# Patient Record
Sex: Female | Born: 1995 | Race: Black or African American | Hispanic: No | Marital: Single | Smoking: Former smoker
Health system: Southern US, Community
[De-identification: ages and names within clinical notes are randomized; demographics above are authoritative.]

## PROBLEM LIST (undated history)

## (undated) ENCOUNTER — Inpatient Hospital Stay (HOSPITAL_COMMUNITY): Payer: Medicaid Other | Admitting: Obstetrics and Gynecology

## (undated) ENCOUNTER — Inpatient Hospital Stay: Payer: Self-pay

## (undated) DIAGNOSIS — Z789 Other specified health status: Secondary | ICD-10-CM

## (undated) DIAGNOSIS — G43909 Migraine, unspecified, not intractable, without status migrainosus: Secondary | ICD-10-CM

## (undated) HISTORY — DX: Migraine, unspecified, not intractable, without status migrainosus: G43.909

## (undated) HISTORY — PX: NO PAST SURGERIES: SHX2092

---
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
## 2017-02-09 ENCOUNTER — Encounter: Payer: Self-pay | Admitting: Emergency Medicine

## 2017-02-09 ENCOUNTER — Emergency Department
Admission: EM | Admit: 2017-02-09 | Discharge: 2017-02-10 | Disposition: A | Discharge status: Home / Self Care | Payer: Medicaid Other | Attending: Student in an Organized Health Care Education/Training Program | Admitting: Student in an Organized Health Care Education/Training Program

## 2017-02-09 ENCOUNTER — Other Ambulatory Visit: Payer: Self-pay

## 2017-02-09 DIAGNOSIS — R509 Fever, unspecified: Secondary | ICD-10-CM | POA: Insufficient documentation

## 2017-02-09 DIAGNOSIS — M791 Myalgia, unspecified site: Secondary | ICD-10-CM | POA: Diagnosis not present

## 2017-02-09 DIAGNOSIS — R69 Illness, unspecified: Secondary | ICD-10-CM

## 2017-02-09 DIAGNOSIS — J029 Acute pharyngitis, unspecified: Secondary | ICD-10-CM | POA: Diagnosis not present

## 2017-02-09 DIAGNOSIS — Z79899 Other long term (current) drug therapy: Secondary | ICD-10-CM | POA: Diagnosis not present

## 2017-02-09 DIAGNOSIS — J111 Influenza due to unidentified influenza virus with other respiratory manifestations: Secondary | ICD-10-CM

## 2017-02-09 LAB — INFLUENZA PANEL BY PCR (TYPE A & B)
Influenza A By PCR: NEGATIVE
Influenza B By PCR: NEGATIVE

## 2017-02-09 LAB — GROUP A STREP BY PCR: Group A Strep by PCR: NOT DETECTED

## 2017-02-09 MED ORDER — ACETAMINOPHEN 325 MG PO TABS
650.0000 mg | ORAL_TABLET | Freq: Once | ORAL | Status: AC | PRN
  Administered 2017-02-09: 650 mg via ORAL
  Filled 2017-02-09: qty 2

## 2017-02-09 NOTE — ED Triage Notes (Signed)
Pt presents to ED with fever, body aches, sore throat. Onset today. Tearful in triage. No acute distress noted.

## 2017-02-09 NOTE — ED Triage Notes (Signed)
Patient to ER for c/o generalized body ache. States she is starting to have a headache now. Patient tearful at stat desk, but in no acute distress.

## 2017-02-10 NOTE — ED Provider Notes (Signed)
[facility] Emergency Department Provider Note  ____________________________________________  Time seen: Approximately 1:06 AM  I have reviewed the triage vital signs and the nursing notes.   HISTORY  Chief Complaint Generalized Body Aches; Fever; and Sore Throat    HPI Robin Roman is a 21 y.o. female presents to the emergency department with headache, pharyngitis, myalgias, rhinorrhea and congestion for 1 day.  Patient has been tolerating fluids and food by mouth but has had diminished appetite.  She denies chest pain, chest tightness, nausea, vomiting and abdominal pain.  Patient has been febrile.  No recent travel.  No alleviating measures of been attempted.  Past Medical History:  Diagnosis Date  . Medical history non-contributory     Patient Active Problem List   Diagnosis Date Noted  . Back pain affecting pregnancy in third trimester 02/11/2015  . Preterm labor 02/11/2015  . Decreased fetal movement 01/03/2015    Past Surgical History:  Procedure Laterality Date  . NO PAST SURGERIES      Prior to Admission medications   Medication Sig Start Date End Date Taking? Authorizing Provider  benzonatate (TESSALON PERLES) 100 MG capsule Take 1 capsule (100 mg total) by mouth 3 (three) times daily as needed for cough. 04/09/16 04/09/17  Enid DerryWagner, Ashley, PA-C  lidocaine (XYLOCAINE) 2 % solution Use as directed 5 mLs in the mouth or throat as needed for mouth pain. 04/09/16   Enid DerryWagner, Ashley, PA-C  naphazoline-pheniramine (NAPHCON-A) 0.025-0.3 % ophthalmic solution Place 1 drop into both eyes 4 (four) times daily as needed for irritation. 10/28/15   Sharman CheekStafford, Phillip, MD  ondansetron [facility](ZOFRAN) 4 MG tablet Take 1 tablet (4 mg total) by mouth daily as needed for nausea or vomiting. 04/09/16 04/09/17  Enid DerryWagner, Ashley, PA-C  tobramycin (TOBREX) 0.3 % ophthalmic solution Place 2 drops into both eyes every 4 (four) hours. While awake 10/25/15   Tommi RumpsSummers, Rhonda L, PA-C     Allergies Patient has no known allergies.  No family history on file.  Social History Social History   Tobacco Use  . Smoking status: Never Smoker  . Smokeless tobacco: Never Used  Substance Use Topics  . Alcohol use: No  . Drug use: No      Review of Systems  Constitutional: Patient has fever.  Eyes: No visual changes. No discharge ENT: Patient has congestion.  Cardiovascular: no chest pain. Respiratory: Patient has cough.  Gastrointestinal: No abdominal pain.  No nausea, no vomiting. Patient had diarrhea.  Genitourinary: Negative for dysuria. No hematuria Musculoskeletal: Patient has myalgias.  Skin: Negative for rash, abrasions, lacerations, ecchymosis. Neurological: Patient has headache, no focal weakness or numbness.      ____________________________________________   PHYSICAL EXAM:  VITAL SIGNS: ED Triage Vitals  Enc Vitals Group     BP 02/09/17 2147 133/77     Pulse Rate 02/09/17 2147 (!) 111     Resp 02/09/17 2147 20     Temp 02/09/17 2147 (!) 102.9 F (39.4 C)     Temp Source 02/09/17 2147 Oral     SpO2 02/09/17 2147 99 %     Weight 02/09/17 2147 200 lb (90.7 kg)     Height 02/09/17 2147 5\' 7"  (1.702 m)     Head Circumference --      Peak Flow --      Pain Score 02/09/17 2146 9     Pain Loc --      Pain Edu? --      Excl. in GC? --  Constitutional: Alert and oriented. Patient is lying supine. Eyes: Conjunctivae are normal. PERRL. EOMI. Head: Atraumatic. ENT:      Ears: Tympanic membranes are mildly injected with mild effusion bilaterally.       Nose: No congestion/rhinnorhea.      Mouth/Throat: Mucous membranes are moist. Posterior pharynx is mildly erythematous.  Hematological/Lymphatic/Immunilogical: No cervical lymphadenopathy.  Cardiovascular: Normal rate, regular rhythm. Normal S1 and S2.  Good peripheral circulation. Respiratory: Normal respiratory effort without tachypnea or retractions. Lungs CTAB. Good air entry to the  bases with no decreased or absent breath sounds. Gastrointestinal: Bowel sounds 4 quadrants. Soft and nontender to palpation. No guarding or rigidity. No palpable masses. No distention. No CVA tenderness. Musculoskeletal: Full range of motion to all extremities. No gross deformities appreciated. Neurologic:  Normal speech and language. No gross focal neurologic deficits are appreciated.  Skin:  Skin is warm, dry and intact. No rash noted. Psychiatric: Mood and affect are normal. Speech and behavior are normal. Patient exhibits appropriate insight and judgement.    ____________________________________________   LABS (all labs ordered are listed, but only abnormal results are displayed)  Labs Reviewed  GROUP A STREP BY PCR  INFLUENZA PANEL BY PCR (TYPE A & B)   ____________________________________________  EKG   ____________________________________________  RADIOLOGY   No results found.  ____________________________________________    PROCEDURES  Procedure(s) performed:    Procedures    Medications  acetaminophen (TYLENOL) tablet 650 mg (650 mg Oral Given 02/09/17 2153)     ____________________________________________   INITIAL IMPRESSION / ASSESSMENT AND PLAN / ED COURSE  Pertinent labs & imaging results that were available during my care of the patient were reviewed by me and considered in my medical decision making (see chart for details).  Review of the [place] CSRS was performed in accordance of the [facility] prior to dispensing any controlled drugs.     Assessment and plan Viral URI Patient presents to the emergency department with rhinorrhea, congestion, nonproductive cough and fever for 1 day.  History and physical exam findings are consistent with an influenza-like illness.  Patient was treated empirically with Tamiflu despite negative influenza testing by PCR.  Patient was advised to follow-up with primary care as needed.  All patient questions were  answered.     ____________________________________________  FINAL CLINICAL IMPRESSION(S) / ED DIAGNOSES  Final diagnoses:  Influenza-like illness      NEW MEDICATIONS STARTED DURING THIS VISIT:  ED Discharge Orders    None          This chart was dictated using voice recognition software/Dragon. Despite best efforts to proofread, errors can occur which can change the meaning. Any change was purely unintentional.    Orvil FeilWoods, Latamara Melder M, PA-C 02/10/17 [id number]    Willy Eddyobinson, Patrick, MD 02/10/17 [phone]

## 2017-06-09 ENCOUNTER — Emergency Department
Admission: EM | Admit: 2017-06-09 | Discharge: 2017-06-09 | Disposition: A | Discharge status: Home / Self Care | Payer: Medicaid Other | Attending: Emergency Medicine | Admitting: Emergency Medicine

## 2017-06-09 ENCOUNTER — Other Ambulatory Visit: Payer: Self-pay

## 2017-06-09 DIAGNOSIS — H109 Unspecified conjunctivitis: Secondary | ICD-10-CM

## 2017-06-09 DIAGNOSIS — Z79899 Other long term (current) drug therapy: Secondary | ICD-10-CM | POA: Insufficient documentation

## 2017-06-09 DIAGNOSIS — J069 Acute upper respiratory infection, unspecified: Secondary | ICD-10-CM | POA: Insufficient documentation

## 2017-06-09 MED ORDER — KETOROLAC TROMETHAMINE 0.4 % OP SOLN: 1.0000 [drp] | Freq: Four times a day (QID) | OPHTHALMIC | 0 refills | Status: DC

## 2017-06-09 MED ORDER — AMOXICILLIN-POT CLAVULANATE 875-125 MG PO TABS: 1.0000 | ORAL_TABLET | Freq: Two times a day (BID) | ORAL | 0 refills | Status: AC

## 2017-06-09 MED ORDER — ERYTHROMYCIN 5 MG/GM OP OINT: TOPICAL_OINTMENT | Freq: Three times a day (TID) | OPHTHALMIC | 0 refills | Status: AC

## 2017-06-09 MED ORDER — LORATADINE 10 MG PO TABS: 10.0000 mg | ORAL_TABLET | Freq: Every day | ORAL | 0 refills | Status: DC

## 2017-06-09 NOTE — ED Provider Notes (Signed)
[facility] Emergency Department Provider Note  ____________________________________________  Time seen: Approximately 9:36 AM  I have reviewed the triage vital signs and the nursing notes.   HISTORY  Chief Complaint Eye Drainage    HPI Robin Roman is a 22 y.o. female that presents to the emergency department for evaluation of right eye redness and swelling for one day and sore throat and ear pain since this morning. Eye has been watering and itching. No foreign body sensation. Eye has been watering but no crusty or yellow drainage. No trauma. No pain. She has tried clear eyes and compresses on the eye. No allergies. No contact use. No vision change, photophobia, ear pain, nausea, vomiting, diarrhea.    Past Medical History:  Diagnosis Date  . Medical history non-contributory     Patient Active Problem List   Diagnosis Date Noted  . Back pain affecting pregnancy in third trimester 02/11/2015  . Preterm labor 02/11/2015  . Decreased fetal movement 01/03/2015    Past Surgical History:  Procedure Laterality Date  . NO PAST SURGERIES      Prior to Admission medications   Medication Sig Start Date End Date Taking? Authorizing Provider  amoxicillin-clavulanate (AUGMENTIN) 875-125 MG tablet Take 1 tablet by mouth 2 (two) times daily for 10 days. 06/09/17 06/19/17  Enid Derry, PA-C  erythromycin [facility]) ophthalmic ointment Place into the right eye 3 (three) times daily for 10 days. Place a 1/2 inch ribbon of ointment into the lower eyelid. 06/09/17 06/19/17  Enid Derry, PA-C  ketorolac (ACULAR) 0.4 % SOLN Apply 1 drop to eye 4 (four) times daily. 06/09/17   Enid Derry, PA-C  lidocaine (XYLOCAINE) 2 % solution Use as directed 5 mLs in the mouth or throat as needed for mouth pain. 04/09/16   Enid Derry, PA-C  loratadine (CLARITIN) 10 MG tablet Take 1 tablet (10 mg total) by mouth daily. 06/09/17 06/09/18  Enid Derry, PA-C   naphazoline-pheniramine (NAPHCON-A) 0.025-0.3 % ophthalmic solution Place 1 drop into both eyes 4 (four) times daily as needed for irritation. 10/28/15   Sharman Cheek, MD  tobramycin (TOBREX) 0.3 % ophthalmic solution Place 2 drops into both eyes every 4 (four) hours. While awake 10/25/15   Tommi Rumps, PA-C    Allergies Patient has no known allergies.  No family history on file.  Social History Social History   Tobacco Use  . Smoking status: Never Smoker  . Smokeless tobacco: Never Used  Substance Use Topics  . Alcohol use: No  . Drug use: No     Review of Systems  Constitutional: No fever/chills Respiratory: No cough. No SOB. Gastrointestinal: No abdominal pain.  No nausea, no vomiting.  Musculoskeletal: Negative for musculoskeletal pain. Skin: Negative for rash, abrasions, lacerations, ecchymosis. Neurological: Negative for headaches   ____________________________________________   PHYSICAL EXAM:  VITAL SIGNS: ED Triage Vitals  Enc Vitals Group     BP 06/09/17 0739 (!) 142/77     Pulse Rate 06/09/17 0739 (!) 108     Resp 06/09/17 0739 17     Temp 06/09/17 0739 98.9 F (37.2 C)     Temp Source 06/09/17 0739 Oral     SpO2 06/09/17 0739 99 %     Weight 06/09/17 0740 210 lb (95.3 kg)     Height 06/09/17 0740 5\' 7"  (1.702 m)     Head Circumference --      Peak Flow --      Pain Score 06/09/17 0739 0  Pain Loc --      Pain Edu? --      Excl. in GC? --      Constitutional: Alert and oriented. Well appearing and in no acute distress. Eyes: Left conjunctivae is normal. Right conjunctivae is injected. PERRL. EOMI. Vision 20/20 bilaterally. No purulent discharge. Mild swelling to right eye with no overlying erythema. Head: Atraumatic. ENT:      Ears: Tympanic membranes erythematous.      Nose: Mild congestion/rhinnorhea.      Mouth/Throat: Mucous membranes are moist. Oropharynx erythematous. Tonsils not enlarged. Uvula midline. Neck: No stridor.    Cardiovascular: Normal rate, regular rhythm.  Good peripheral circulation. Respiratory: Normal respiratory effort without tachypnea or retractions. Lungs CTAB. Good air entry to the bases with no decreased or absent breath sounds. Gastrointestinal: Bowel sounds 4 quadrants. Soft and nontender to palpation. No guarding or rigidity. No palpable masses. No distention.  Musculoskeletal: Full range of motion to all extremities. No gross deformities appreciated. Neurologic:  Normal speech and language. No gross focal neurologic deficits are appreciated.  Skin:  Skin is warm, dry and intact. No rash noted. Psychiatric: Mood and affect are normal. Speech and behavior are normal. Patient exhibits appropriate insight and judgement.   ____________________________________________   LABS (all labs ordered are listed, but only abnormal results are displayed)  Labs Reviewed - No data to display ____________________________________________  EKG   ____________________________________________  RADIOLOGY   No results found.  ____________________________________________    PROCEDURES  Procedure(s) performed:    Procedures    Medications - No data to display   ____________________________________________   INITIAL IMPRESSION / ASSESSMENT AND PLAN / ED COURSE  Pertinent labs & imaging results that were available during my care of the patient were reviewed by me and considered in my medical decision making (see chart for details).  Review of the [place] CSRS was performed in accordance of the [facility] prior to dispensing any controlled drugs.   Patient's diagnosis is consistent with URI and conjunctivitis. Vital signs and exam are reassuring. Conjunctivitis is likely viral or allergic. Patient will be discharged home with prescriptions for claritin, acular, augmentin, erythromycin ointment.  She will be given a prescription for antibiotics to take if symptoms worsen or do not improve with  claritin and acular. Patient is to follow up with PCP as directed. Patient is given ED precautions to return to the ED for any worsening or new symptoms. Mother was notified.     ____________________________________________  FINAL CLINICAL IMPRESSION(S) / ED DIAGNOSES  Final diagnoses:  Conjunctivitis of right eye, unspecified conjunctivitis type  Upper respiratory tract infection, unspecified type      NEW MEDICATIONS STARTED DURING THIS VISIT:  ED Discharge Orders        Ordered    loratadine (CLARITIN) 10 MG tablet  Daily     06/09/17 1023    ketorolac (ACULAR) 0.4 % SOLN  4 times daily     06/09/17 1023    amoxicillin-clavulanate (AUGMENTIN) 875-125 MG tablet  2 times daily     06/09/17 1023    erythromycin [facility](ROMYCIN) ophthalmic ointment  3 times daily     06/09/17 1023          This chart was dictated using voice recognition software/Dragon. Despite best efforts to proofread, errors can occur which can change the meaning. Any change was purely unintentional.    Enid DerryWagner, Matison Nuccio, PA-C 06/09/17 1434    Phineas SemenGoodman, Graydon, MD 06/09/17 [phone]

## 2017-06-09 NOTE — ED Triage Notes (Signed)
Pt c/o right eye redess and irritation for the past 2 days, denies injury

## 2017-06-09 NOTE — Discharge Instructions (Addendum)
Begin Acular and Claritin today. Begin erythromycin and Augmentin tomorrow if symptoms worsen or are not improving.

## 2017-06-09 NOTE — ED Notes (Addendum)
See triage note  Presents with drainage and irritation to right eye  Sx;'s started 2 days ago. also this am woke up with slight sore throat

## 2017-06-12 ENCOUNTER — Emergency Department
Admission: EM | Admit: 2017-06-12 | Discharge: 2017-06-12 | Disposition: A | Discharge status: Home / Self Care | Payer: Medicaid Other | Attending: Emergency Medicine | Admitting: Emergency Medicine

## 2017-06-12 ENCOUNTER — Other Ambulatory Visit: Payer: Self-pay

## 2017-06-12 ENCOUNTER — Encounter: Payer: Self-pay | Admitting: Emergency Medicine

## 2017-06-12 DIAGNOSIS — J029 Acute pharyngitis, unspecified: Secondary | ICD-10-CM | POA: Insufficient documentation

## 2017-06-12 DIAGNOSIS — Z79899 Other long term (current) drug therapy: Secondary | ICD-10-CM | POA: Insufficient documentation

## 2017-06-12 DIAGNOSIS — M791 Myalgia, unspecified site: Secondary | ICD-10-CM | POA: Insufficient documentation

## 2017-06-12 DIAGNOSIS — H9203 Otalgia, bilateral: Secondary | ICD-10-CM | POA: Insufficient documentation

## 2017-06-12 MED ORDER — PSEUDOEPH-BROMPHEN-DM 30-2-10 MG/5ML PO SYRP: 5.0000 mL | ORAL_SOLUTION | Freq: Four times a day (QID) | ORAL | 0 refills | Status: DC | PRN

## 2017-06-12 MED ORDER — DIPHENHYDRAMINE HCL 12.5 MG/5ML PO ELIX
12.5000 mg | ORAL_SOLUTION | Freq: Once | ORAL | Status: AC
  Administered 2017-06-12: 12.5 mg via ORAL
  Filled 2017-06-12: qty 5

## 2017-06-12 MED ORDER — LIDOCAINE VISCOUS 2 % MT SOLN
5.0000 mL | Freq: Once | OROMUCOSAL | Status: AC
  Administered 2017-06-12: 5 mL via OROMUCOSAL
  Filled 2017-06-12: qty 15

## 2017-06-12 MED ORDER — LIDOCAINE VISCOUS 2 % MT SOLN: 5.0000 mL | Freq: Four times a day (QID) | OROMUCOSAL | 0 refills | Status: DC | PRN

## 2017-06-12 NOTE — ED Triage Notes (Signed)
Sore throat x 2 days. R eye irritation x 3 days.

## 2017-06-12 NOTE — Discharge Instructions (Signed)
Advised to fill prescription for antibiotics from previous visit.

## 2017-06-12 NOTE — ED Provider Notes (Signed)
[facility] Emergency Department Provider Note   ____________________________________________   First MD Initiated Contact with Patient 06/12/17 0745     (approximate)  I have reviewed the triage vital signs and the nursing notes.   HISTORY  Chief Complaint Sore Throat    HPI Robin Roman is a 22 y.o. female patient was seen this facility 2 days ago sore throat and irritation.  Patient was diagnosed with pharyngitis conjunctivitis.  Patient given a prescription for eyedrops, antihistamine, and Augmentin Augmentin.  Patient state she did not fill the antibiotic because she was confused on the discharge instructions.  Patient state complaint improving with the eyedrops and antihistamine but sore throat has worsened.  Patient rates the pain as 8/10.  Patient describes her pain as "sore".  Patient cannot tolerate food and fluids with discomfort.  Past Medical History:  Diagnosis Date  . Medical history non-contributory     Patient Active Problem List   Diagnosis Date Noted  . Back pain affecting pregnancy in third trimester 02/11/2015  . Preterm labor 02/11/2015  . Decreased fetal movement 01/03/2015    Past Surgical History:  Procedure Laterality Date  . NO PAST SURGERIES      Prior to Admission medications   Medication Sig Start Date End Date Taking? Authorizing Provider  amoxicillin-clavulanate (AUGMENTIN) 875-125 MG tablet Take 1 tablet by mouth 2 (two) times daily for 10 days. 06/09/17 06/19/17  Enid Derry, PA-C  brompheniramine-pseudoephedrine-DM 30-2-10 MG/5ML syrup Take 5 mLs by mouth 4 (four) times daily as needed. Mix with 5 mL of viscous lidocaine for swish and swallow. 06/12/17   Joni Reining, PA-C  erythromycin [facility]) ophthalmic ointment Place into the right eye 3 (three) times daily for 10 days. Place a 1/2 inch ribbon of ointment into the lower eyelid. 06/09/17 06/19/17  Enid Derry, PA-C  ketorolac (ACULAR) 0.4 % SOLN Apply 1  drop to eye 4 (four) times daily. 06/09/17   Enid Derry, PA-C  lidocaine (XYLOCAINE) 2 % solution Use as directed 5 mLs in the mouth or throat as needed for mouth pain. 04/09/16   Enid Derry, PA-C  lidocaine (XYLOCAINE) 2 % solution Use as directed 5 mLs in the mouth or throat every 6 (six) hours as needed for mouth pain. Mix with Bromfed-DM for swish and swallow. 06/12/17   Joni Reining, PA-C  loratadine (CLARITIN) 10 MG tablet Take 1 tablet (10 mg total) by mouth daily. 06/09/17 06/09/18  Enid Derry, PA-C  naphazoline-pheniramine (NAPHCON-A) 0.025-0.3 % ophthalmic solution Place 1 drop into both eyes 4 (four) times daily as needed for irritation. 10/28/15   Sharman Cheek, MD  tobramycin (TOBREX) 0.3 % ophthalmic solution Place 2 drops into both eyes every 4 (four) hours. While awake 10/25/15   Tommi Rumps, PA-C    Allergies Patient has no known allergies.  No family history on file.  Social History Social History   Tobacco Use  . Smoking status: Never Smoker  . Smokeless tobacco: Never Used  Substance Use Topics  . Alcohol use: No  . Drug use: No    Review of Systems Constitutional: No fever/chills Eyes: No visual changes. ENT: Sore throat.   Cardiovascular: Denies chest pain. Respiratory: Denies shortness of breath. Gastrointestinal: No abdominal pain.  No nausea, no vomiting.  No diarrhea.  No constipation. Genitourinary: Negative for dysuria. Musculoskeletal: Negative for back pain. Skin: Negative for rash. Neurological: Negative for headaches, focal weakness or numbness.   ____________________________________________   PHYSICAL EXAM:  VITAL SIGNS: ED Triage Vitals  Enc Vitals Group     BP 06/12/17 0741 135/77     Pulse Rate 06/12/17 0741 (!) 101     Resp 06/12/17 0741 20     Temp 06/12/17 0741 100.3 F (37.9 C)     Temp Source 06/12/17 0741 Oral     SpO2 06/12/17 0741 100 %     Weight 06/12/17 0742 210 lb (95.3 kg)     Height 06/12/17 0742   (1.727 m)     Head Circumference --      Peak Flow --      Pain Score 06/12/17 0742 8     Pain Loc --      Pain Edu? --      Excl. in GC? --    Constitutional: Alert and oriented. Well appearing and in no acute distress. Eyes: Conjunctivae are erythematous l. PERRL. EOMI. Head: Atraumatic. Nose: Edematous nasal turbinates clear rhinorrhea Mouth/Throat: Mucous membranes are moist.  Oropharynx erythematous. Neck: No stridor.  Hematological/Lymphatic/Immunilogical: No cervical lymphadenopathy. Cardiovascular: Normal rate, regular rhythm. Grossly normal heart sounds.  Good peripheral circulation. Respiratory: Normal respiratory effort.  No retractions. Lungs CTAB. Skin:  Skin is warm, dry and intact. No rash noted. Psychiatric: Mood and affect are normal. Speech and behavior are normal.  ____________________________________________   LABS (all labs ordered are listed, but only abnormal results are displayed)  Labs Reviewed - No data to display ____________________________________________  EKG   ____________________________________________  RADIOLOGY  ED MD interpretation:    Official radiology report(s): No results found.  ____________________________________________   PROCEDURES  Procedure(s) performed: None  Procedures  Critical Care performed: No  ____________________________________________   INITIAL IMPRESSION / ASSESSMENT AND PLAN / ED COURSE  As part of my medical decision making, I reviewed the following data within the electronic MEDICAL RECORD NUMBER    Patient presents with sore throat for 2 days.  Patient was confusional discharge care instructions and did not fill antibiotics as directed.  Patient given a mix of viscous lidocaine and Benadryl with relief of sore throat.  Patient advised to fill prescription.  Patient also given a prescription for Benadryl elixir and viscous lidocaine to mix and take as directed.  Patient advised to follow-up with  PCP.      ____________________________________________   FINAL CLINICAL IMPRESSION(S) / ED DIAGNOSES  Final diagnoses:  Pharyngitis, unspecified etiology     ED Discharge Orders        Ordered    lidocaine (XYLOCAINE) 2 % solution  Every 6 hours PRN     06/12/17 0755    brompheniramine-pseudoephedrine-DM 30-2-10 MG/5ML syrup  4 times daily PRN     06/12/17 0755       Note:  This document was prepared using Dragon voice recognition software and may include unintentional dictation errors.    Joni Reining, PA-C 06/12/17 0802    Schaevitz, Myra Rude, MD 06/12/17 [phone]

## 2017-06-12 NOTE — ED Notes (Signed)
See triage note  Presents with sore throat and body aches  Was seen couple of days ago for pink eye and sore throat  States she was given rx's for same. But did not get the Augmentin filled  States she was told to get eye meds and claritin first   Low grade fever on arrival  States her sore throat is getting worse with body aches and bilateral ear pain

## 2017-06-24 ENCOUNTER — Encounter: Payer: Self-pay | Admitting: Emergency Medicine

## 2017-06-24 ENCOUNTER — Other Ambulatory Visit: Payer: Self-pay

## 2017-06-24 ENCOUNTER — Emergency Department
Admission: EM | Admit: 2017-06-24 | Discharge: 2017-06-24 | Disposition: A | Discharge status: Home / Self Care | Payer: Medicaid Other | Attending: Emergency Medicine | Admitting: Emergency Medicine

## 2017-06-24 DIAGNOSIS — B373 Candidiasis of vulva and vagina: Secondary | ICD-10-CM

## 2017-06-24 DIAGNOSIS — B3731 Acute candidiasis of vulva and vagina: Secondary | ICD-10-CM

## 2017-06-24 DIAGNOSIS — L290 Pruritus ani: Secondary | ICD-10-CM

## 2017-06-24 LAB — WET PREP, GENITAL
CLUE CELLS WET PREP: NONE SEEN
Sperm: NONE SEEN
Trich, Wet Prep: NONE SEEN

## 2017-06-24 MED ORDER — FLUCONAZOLE 150 MG PO TABS: 150.0000 mg | ORAL_TABLET | Freq: Every day | ORAL | 0 refills | Status: DC

## 2017-06-24 NOTE — Discharge Instructions (Addendum)
Follow-up with your primary care doctor at [facility] or follow-up with [facility] acute care.  Begin doing sitz bath twice a day.  Do not apply any medication to the rectal area.  Take Diflucan which should take care of your yeast infection.  You may also obtain Monistat over-the-counter as needed for exterior vaginal itching.

## 2017-06-24 NOTE — ED Notes (Signed)
When this nurse went to discharge pt she wants provider to look at her "private area"  Complaining of itching no discahrge

## 2017-06-24 NOTE — ED Notes (Signed)
See triage note. States she was seen and treated for pink eye and URI recently  States her stools have been a different color  Now over the past 2-3 days she has had some rectal itching

## 2017-06-24 NOTE — ED Triage Notes (Signed)
Here for itching to rectum X 2 days. VSS. No fever.  Pt wants someone to look and see if she has a hemorrhoid that is making her itch.

## 2017-06-24 NOTE — ED Provider Notes (Signed)
[facility] Emergency Department Provider Note  ____________________________________________   First MD Initiated Contact with Patient 06/24/17 0740     (approximate)  I have reviewed the triage vital signs and the nursing notes.   HISTORY  Chief Complaint itches at rectum  HPI Robin Roman is a 22 y.o. female is here with complaint of anal itching for 2 days.  Patient states that she is worried that she has a hemorrhoid.  She also complains of some vaginal itching but denies any dysuria or frequency.  Patient states she has been drinking cranberry juice without any relief.   Past Medical History:  Diagnosis Date  . Medical history non-contributory     Patient Active Problem List   Diagnosis Date Noted  . Back pain affecting pregnancy in third trimester 02/11/2015  . Preterm labor 02/11/2015  . Decreased fetal movement 01/03/2015    Past Surgical History:  Procedure Laterality Date  . NO PAST SURGERIES      Prior to Admission medications   Medication Sig Start Date End Date Taking? Authorizing Provider  fluconazole (DIFLUCAN) 150 MG tablet Take 1 tablet (150 mg total) by mouth daily. 06/24/17   Tommi Rumps, PA-C    Allergies Patient has no known allergies.  History reviewed. No pertinent family history.  Social History Social History   Tobacco Use  . Smoking status: Never Smoker  . Smokeless tobacco: Never Used  Substance Use Topics  . Alcohol use: No  . Drug use: No    Review of Systems Constitutional: No fever/chills Cardiovascular: Denies chest pain. Respiratory: Denies shortness of breath. Gastrointestinal:   No nausea, no vomiting.  Genitourinary: Negative for dysuria.  Positive for vaginal itching.  Positive for anal itching. Musculoskeletal: Negative for back pain. Skin: Negative for rash. Neurological: Negative for headaches, focal weakness or numbness. ___________________________________________   PHYSICAL  EXAM:  VITAL SIGNS: ED Triage Vitals  Enc Vitals Group     BP 06/24/17 0737 139/82     Pulse Rate 06/24/17 0736 74     Resp 06/24/17 0736 16     Temp 06/24/17 0736 98.7 F (37.1 C)     Temp Source 06/24/17 0736 Oral     SpO2 06/24/17 0736 99 %     Weight 06/24/17 0736 210 lb (95.3 kg)     Height 06/24/17 0736  (1.727 m)     Head Circumference --      Peak Flow --      Pain Score 06/24/17 0736 0     Pain Loc --      Pain Edu? --      Excl. in GC? --    Constitutional: Alert and oriented. Well appearing and in no acute distress. Eyes: Conjunctivae are normal.  Head: Atraumatic. Neck: No stridor.   Cardiovascular: Normal rate, regular rhythm. Grossly normal heart sounds.  Good peripheral circulation. Respiratory: Normal respiratory effort.  No retractions. Lungs CTAB. Gastrointestinal: Soft and nontender.  Rectal exam is negative with no fissures or hemorrhoids noted. Genitourinary: There is white vaginal secretions noted.  No external genitalia abnormalities noted. Musculoskeletal: Moves upper and lower extremities any difficulty.  Normal gait was noted. Neurologic:  Normal speech and language. No gross focal neurologic deficits are appreciated.  Skin:  Skin is warm, dry and intact. No rash noted. Psychiatric: Mood and affect are normal. Speech and behavior are normal.  ____________________________________________   LABS (all labs ordered are listed, but only abnormal results are displayed)  Labs Reviewed  WET PREP, GENITAL - Abnormal; Notable for the following components:      Result Value   Yeast Wet Prep HPF POC PRESENT (*)    WBC, Wet Prep HPF POC RARE (*)    All other components within normal limits   ________________________________________   PROCEDURES  Procedure(s) performed: None  Procedures  Critical Care performed: No  ____________________________________________   INITIAL IMPRESSION / ASSESSMENT AND PLAN / ED COURSE Patient was made aware  that her wet prep did show evidence of yeast.  This most likely is because of her anal itching as well.  Patient was given a prescription for Diflucan 150 mg 1 tablet.  She was also made aware that she could buy over-the-counter Monistat cream to apply to the area as needed for itching control.  She is to follow-up with her PCP at [facility] if any continued problems.  ____________________________________________   FINAL CLINICAL IMPRESSION(S) / ED DIAGNOSES  Final diagnoses:  Pruritus ani  Yeast vaginitis     ED Discharge Orders        Ordered    fluconazole (DIFLUCAN) 150 MG tablet  Daily     06/24/17 0859       Note:  This document was prepared using Dragon voice recognition software and may include unintentional dictation errors.    Tommi Rumps, PA-C 06/24/17 0912    Nita Sickle, MD 06/24/17 1057

## 2017-08-21 ENCOUNTER — Emergency Department
Admission: EM | Admit: 2017-08-21 | Discharge: 2017-08-21 | Disposition: A | Discharge status: Home / Self Care | Payer: Medicaid Other | Attending: Emergency Medicine | Admitting: Emergency Medicine

## 2017-08-21 ENCOUNTER — Other Ambulatory Visit: Payer: Self-pay

## 2017-08-21 ENCOUNTER — Encounter: Payer: Self-pay | Admitting: Emergency Medicine

## 2017-08-21 DIAGNOSIS — N6011 Diffuse cystic mastopathy of right breast: Secondary | ICD-10-CM | POA: Insufficient documentation

## 2017-08-21 DIAGNOSIS — N644 Mastodynia: Secondary | ICD-10-CM

## 2017-08-21 DIAGNOSIS — F17290 Nicotine dependence, other tobacco product, uncomplicated: Secondary | ICD-10-CM | POA: Insufficient documentation

## 2017-08-21 DIAGNOSIS — Z79899 Other long term (current) drug therapy: Secondary | ICD-10-CM | POA: Insufficient documentation

## 2017-08-21 DIAGNOSIS — N6012 Diffuse cystic mastopathy of left breast: Secondary | ICD-10-CM | POA: Insufficient documentation

## 2017-08-21 MED ORDER — IBUPROFEN 800 MG PO TABS: 800.0000 mg | ORAL_TABLET | Freq: Three times a day (TID) | ORAL | 0 refills | Status: DC | PRN

## 2017-08-21 NOTE — ED Notes (Signed)
Pt c/o right breast lump with pain. Does not have obgyn. [facility] No redness or heat.

## 2017-08-21 NOTE — ED Notes (Signed)
This RN went in to discharge pt; pt was not in room. No discharge instructions given by this RN. Janise, PA-C did give pt instructions at bedside prior to patient walking out, per provider.

## 2017-08-21 NOTE — ED Notes (Signed)
Pt reports right breast tenderness, particularly on top x 1 week. No injury. States it is a dull, constant, tender pain.

## 2017-08-21 NOTE — ED Notes (Signed)
ED Provider at bedside. 

## 2017-08-21 NOTE — Discharge Instructions (Addendum)
You have a common breast condition called fibrocystic breast syndrome. This may cause intermittent breast pain and tenderness. Take the ibuprofen as needed. You may also apply warm compresses to reduce pain. Follow-up with Dr. Tracey HarriesPringle for a primary provider referral [facility] for further evaulation.

## 2017-08-21 NOTE — ED Triage Notes (Addendum)
R breast pain x 1 week. Denies fall or injury. States worse with palpation.

## 2017-08-22 NOTE — ED Provider Notes (Signed)
[facility] Emergency Department Provider Note ____________________________________________  Time seen: 1120  I have reviewed the triage vital signs and the nursing notes.  HISTORY  Chief Complaint  Breast Pain  HPI Robin Roman is a 22 y.o. female presents herself to the ED for evaluation of right breast pain.  Injury, accident, or trauma.  She also denies any swelling, redness, warmth, induration, or nipple discharge. She describes intermittent sharp pain to the upper lobes of the right breast. She notes intermittent symptoms over the last week. She denies any typical premenstrual symptoms.   Past Medical History:  Diagnosis Date  . Medical history non-contributory     Patient Active Problem List   Diagnosis Date Noted  . Back pain affecting pregnancy in third trimester 02/11/2015  . Preterm labor 02/11/2015  . Decreased fetal movement 01/03/2015    Past Surgical History:  Procedure Laterality Date  . NO PAST SURGERIES      Prior to Admission medications   Medication Sig Start Date End Date Taking? Authorizing Provider  fluconazole (DIFLUCAN) 150 MG tablet Take 1 tablet (150 mg total) by mouth daily. 06/24/17   Tommi RumpsSummers, Rhonda L, PA-C  ibuprofen (ADVIL,MOTRIN) 800 MG tablet Take 1 tablet (800 mg total) by mouth every 8 (eight) hours as needed. 08/21/17   Medha Pippen, Charlesetta IvoryJenise V Bacon, PA-C    Allergies Patient has no known allergies.  No family history on file.  Social History Social History   Tobacco Use  . Smoking status: Current Every Day Smoker    Packs/day: 1.00    Types: Cigars  . Smokeless tobacco: Never Used  Substance Use Topics  . Alcohol use: No  . Drug use: No    Review of Systems  Constitutional: Negative for fever. Eyes: Negative for visual changes. ENT: Negative for sore throat. Cardiovascular: Negative for chest pain. Respiratory: Negative for shortness of breath. Gastrointestinal: Negative for abdominal pain, vomiting  and diarrhea. Genitourinary: Negative for dysuria. Musculoskeletal: Negative for back pain. Skin: Negative for rash. Right breast tenderness as above Neurological: Negative for headaches, focal weakness or numbness. ____________________________________________  PHYSICAL EXAM:  VITAL SIGNS: ED Triage Vitals  Enc Vitals Group     BP 08/21/17 1020 (!) 150/73     Pulse Rate 08/21/17 1020 91     Resp 08/21/17 1020 18     Temp 08/21/17 1020 98.6 F (37 C)     Temp Source 08/21/17 1020 Oral     SpO2 08/21/17 1020 99 %     Weight 08/21/17 1021 200 lb (90.7 kg)     Height 08/21/17 1021 5\' 8"  (1.727 m)     Head Circumference --      Peak Flow --      Pain Score 08/21/17 1032 2     Pain Loc --      Pain Edu? --      Excl. in GC? --     Constitutional: Alert and oriented. Well appearing and in no distress. Head: Normocephalic and atraumatic. Eyes: Conjunctivae are normal. Normal extraocular movements Neck: Supple. No thyromegaly. Hematological/Lymphatic/Immunological: No cervical supra-/infraclavicular, or axillary lymphadenopathy. Breast: normal gross appearance of the breast. No retractions, dimpling, or peau 'd orange skin appearance. Palpable cystic variations noted to the breast tissue, most prominent between the two upper lobes. No nipple retraction or discharge noted.  Cardiovascular: Normal rate, regular rhythm. Normal distal pulses. Respiratory: Normal respiratory effort. No wheezes/rales/rhonchi. Musculoskeletal: Nontender with normal range of motion in all extremities.  Neurologic:  Normal gait without ataxia. Normal speech and language. No gross focal neurologic deficits are appreciated. Skin:  Skin is warm, dry and intact. No rash noted. ____________________________________________  INITIAL IMPRESSION / ASSESSMENT AND PLAN / ED COURSE  Patient with ED evaluation of intermittent, fleeting right breast tenderness. The patient exam does not represent any infectious etiology.  He symptoms likely represent a intermittent mastodynia and underlying fibrocystic breast changes. She is reassured by the exam and diagnosis. She will follow-up with her provider for ongoing symptoms. She may take OTC ibuprofen as needed. She is referred to [facility] for further eval as needed.  ____________________________________________  FINAL CLINICAL IMPRESSION(S) / ED DIAGNOSES  Final diagnoses:  Fibrocystic breast changes of both breasts  Mastodynia of right breast      Karmen Stabs, Charlesetta Ivory, PA-C 08/22/17 1611    Jene Every, MD 08/23/17 [phone]

## 2017-10-16 ENCOUNTER — Emergency Department
Admission: EM | Admit: 2017-10-16 | Discharge: 2017-10-16 | Disposition: A | Discharge status: Home / Self Care | Payer: Medicaid Other | Attending: Emergency Medicine | Admitting: Emergency Medicine

## 2017-10-16 ENCOUNTER — Other Ambulatory Visit: Payer: Self-pay

## 2017-10-16 ENCOUNTER — Encounter: Payer: Self-pay | Admitting: Intensive Care

## 2017-10-16 DIAGNOSIS — J0100 Acute maxillary sinusitis, unspecified: Secondary | ICD-10-CM

## 2017-10-16 DIAGNOSIS — H02845 Edema of left lower eyelid: Secondary | ICD-10-CM | POA: Insufficient documentation

## 2017-10-16 DIAGNOSIS — Z79899 Other long term (current) drug therapy: Secondary | ICD-10-CM | POA: Insufficient documentation

## 2017-10-16 DIAGNOSIS — F17200 Nicotine dependence, unspecified, uncomplicated: Secondary | ICD-10-CM | POA: Insufficient documentation

## 2017-10-16 DIAGNOSIS — H02846 Edema of left eye, unspecified eyelid: Secondary | ICD-10-CM

## 2017-10-16 MED ORDER — SULFAMETHOXAZOLE-TRIMETHOPRIM 800-160 MG PO TABS: 1.0000 | ORAL_TABLET | Freq: Two times a day (BID) | ORAL | 0 refills | Status: AC

## 2017-10-16 MED ORDER — AMOXICILLIN-POT CLAVULANATE 875-125 MG PO TABS: 1.0000 | ORAL_TABLET | Freq: Two times a day (BID) | ORAL | 0 refills | Status: AC

## 2017-10-16 NOTE — ED Provider Notes (Signed)
[facility] Emergency Department Provider Note  ____________________________________________  Time seen: Approximately 9:09 AM  I have reviewed the triage vital signs and the nursing notes.   HISTORY  Chief Complaint Conjunctivitis (left)    HPI Robin Roman is a 22 y.o. female that presents emergency department for evaluation of mild left lower eyelid swelling for 1 day.  Patient states that she wakes up in the morning and has some crusting to the inside of her eye.  Her daughter recently had pinkeye.  Patient has been sick with nasal congestion for the last 2 weeks.  She does not have a red eye, eye pain, and is not having any trouble seeing.  No fever, chills, headache, cough, shortness of breath.  Past Medical History:  Diagnosis Date  . Medical history non-contributory     Patient Active Problem List   Diagnosis Date Noted  . Back pain affecting pregnancy in third trimester 02/11/2015  . Preterm labor 02/11/2015  . Decreased fetal movement 01/03/2015    Past Surgical History:  Procedure Laterality Date  . NO PAST SURGERIES      Prior to Admission medications   Medication Sig Start Date End Date Taking? Authorizing Provider  amoxicillin-clavulanate (AUGMENTIN) 875-125 MG tablet Take 1 tablet by mouth 2 (two) times daily for 10 days. 10/16/17 10/26/17  Enid Derry, PA-C  fluconazole (DIFLUCAN) 150 MG tablet Take 1 tablet (150 mg total) by mouth daily. 06/24/17   Tommi Rumps, PA-C  ibuprofen (ADVIL,MOTRIN) 800 MG tablet Take 1 tablet (800 mg total) by mouth every 8 (eight) hours as needed. 08/21/17   Menshew, Charlesetta Ivory, PA-C  sulfamethoxazole-trimethoprim (BACTRIM DS,SEPTRA DS) 800-160 MG tablet Take 1 tablet by mouth 2 (two) times daily for 7 days. 10/16/17 10/23/17  Enid Derry, PA-C    Allergies Patient has no known allergies.  History reviewed. No pertinent family history.  Social History Social History   Tobacco Use  .  Smoking status: Current Some Day Smoker    Packs/day: 1.00    Types: Cigars  . Smokeless tobacco: Never Used  Substance Use Topics  . Alcohol use: No  . Drug use: No     Review of Systems  Constitutional: No fever/chills ENT: Positive for nasal congestion. Cardiovascular: No chest pain. Respiratory: No cough. No SOB. Gastrointestinal: No nausea, no vomiting.  Musculoskeletal: Negative for musculoskeletal pain. Skin: Negative for abrasions, lacerations, ecchymosis. Neurological: Negative for headaches   ____________________________________________   PHYSICAL EXAM:  VITAL SIGNS: ED Triage Vitals  Enc Vitals Group     BP 10/16/17 0854 127/69     Pulse Rate 10/16/17 0854 74     Resp 10/16/17 0854 14     Temp 10/16/17 0854 98.9 F (37.2 C)     Temp Source 10/16/17 0854 Oral     SpO2 10/16/17 0854 100 %     Weight 10/16/17 0855 200 lb (90.7 kg)     Height 10/16/17 0855 5\' 7"  (1.702 m)     Head Circumference --      Peak Flow --      Pain Score 10/16/17 0855 0     Pain Loc --      Pain Edu? --      Excl. in GC? --      Constitutional: Alert and oriented. Well appearing and in no acute distress. Eyes: Conjunctivae are normal. PERRL. EOMI. Mild swelling and erythema to left lower eyelid. Head: Atraumatic. ENT:      Ears:  Nose: Mild congestion/rhinnorhea.      Mouth/Throat: Mucous membranes are moist.  Neck: No stridor.   Cardiovascular: Normal rate, regular rhythm.  Good peripheral circulation. Respiratory: Normal respiratory effort without tachypnea or retractions. Lungs CTAB. Good air entry to the bases with no decreased or absent breath sounds. Musculoskeletal: Full range of motion to all extremities. No gross deformities appreciated. Neurologic:  Normal speech and language. No gross focal neurologic deficits are appreciated.  Skin:  Skin is warm, dry and intact. No rash noted. Psychiatric: Mood and affect are normal. Speech and behavior are normal. Patient  exhibits appropriate insight and judgement.   ____________________________________________   LABS (all labs ordered are listed, but only abnormal results are displayed)  Labs Reviewed - No data to display ____________________________________________  EKG   ____________________________________________  RADIOLOGY   No results found.  ____________________________________________    PROCEDURES  Procedure(s) performed:    Procedures    Medications - No data to display   ____________________________________________   INITIAL IMPRESSION / ASSESSMENT AND PLAN / ED COURSE  Pertinent labs & imaging results that were available during my care of the patient were reviewed by me and considered in my medical decision making (see chart for details).  Review of the [place] CSRS was performed in accordance of the [facility] prior to dispensing any controlled drugs.     Patient's diagnosis is consistent with sinusitis.  Vital signs and exam are reassuring.  Patient is starting to have some mild swelling to lower left eyelid.  Patient will be given Bactrim and Augmentin to cover for preseptal cellulitis.  Patient will be discharged home with prescriptions for Bactrim and Augmentin. Patient is to follow up with primary care as directed. Patient is given ED precautions to return to the ED for any worsening or new symptoms.     ____________________________________________  FINAL CLINICAL IMPRESSION(S) / ED DIAGNOSES  Final diagnoses:  Acute maxillary sinusitis, recurrence not specified  Swelling of left eyelid      NEW MEDICATIONS STARTED DURING THIS VISIT:  ED Discharge Orders         Ordered    amoxicillin-clavulanate (AUGMENTIN) 875-125 MG tablet  2 times daily     10/16/17 0941    sulfamethoxazole-trimethoprim (BACTRIM DS,SEPTRA DS) 800-160 MG tablet  2 times daily     10/16/17 0941              This chart was dictated using voice recognition software/Dragon.  Despite best efforts to proofread, errors can occur which can change the meaning. Any change was purely unintentional.    Enid DerryWagner, Ziaire Hagos, PA-C 10/16/17 1504    Arnaldo NatalMalinda, Paul F, MD 10/16/17 [phone]

## 2017-10-16 NOTE — ED Triage Notes (Signed)
Patient is here for possible pink eye (left). Reports some drainage and puffiness around eye. Reports her daughter just got over pink eye

## 2018-12-21 ENCOUNTER — Other Ambulatory Visit: Payer: Self-pay

## 2018-12-21 DIAGNOSIS — Z20822 Contact with and (suspected) exposure to covid-19: Secondary | ICD-10-CM

## 2018-12-22 LAB — NOVEL CORONAVIRUS, NAA: SARS-CoV-2, NAA: NOT DETECTED

## 2018-12-23 ENCOUNTER — Telehealth: Payer: Self-pay | Admitting: General Practice

## 2018-12-23 NOTE — Telephone Encounter (Signed)
Negative COVID results given. Patient results "NOT Detected." Caller expressed understanding. ° °

## 2019-03-13 ENCOUNTER — Other Ambulatory Visit: Payer: Self-pay

## 2019-03-13 ENCOUNTER — Encounter: Payer: Self-pay | Admitting: Emergency Medicine

## 2019-03-13 ENCOUNTER — Emergency Department
Admission: EM | Admit: 2019-03-13 | Discharge: 2019-03-13 | Disposition: A | Discharge status: Home / Self Care | Payer: Self-pay

## 2019-03-13 NOTE — ED Notes (Signed)
Pt called x 1 from lobby.  Notified by security that pt was waiting in her car.  Security to front door to attempt to see, pt, could not visualize her in parking lot.  This RN attempted to call pt from number in chart, number was her mother, who states she will attempt to reach pt.  Will attempt to call her from lobby again.

## 2019-03-13 NOTE — ED Notes (Signed)
Pt again called in lobby with no answer.  Pt LWOT at this time.

## 2019-03-13 NOTE — ED Triage Notes (Signed)
Patient presents to the ED with abdominal pain and vomiting.  Patient is in no obvious distress at this time.  Patient drinking large brown colored drink while checking in at triage.

## 2019-03-13 NOTE — ED Notes (Signed)
Called in the waiting room with no answers.

## 2019-03-13 NOTE — ED Notes (Signed)
Pt called from lobby with no answer.  Stepped to front door, no one waiting outside, could not see pt in parking lot.  Will call once more.

## 2019-03-17 ENCOUNTER — Emergency Department
Admission: EM | Admit: 2019-03-17 | Discharge: 2019-03-17 | Disposition: A | Discharge status: Home / Self Care | Payer: Self-pay | Attending: Emergency Medicine | Admitting: Emergency Medicine

## 2019-03-17 ENCOUNTER — Other Ambulatory Visit: Payer: Self-pay

## 2019-03-17 ENCOUNTER — Encounter: Payer: Self-pay | Admitting: Emergency Medicine

## 2019-03-17 ENCOUNTER — Emergency Department: Payer: Self-pay

## 2019-03-17 DIAGNOSIS — O26891 Other specified pregnancy related conditions, first trimester: Secondary | ICD-10-CM | POA: Insufficient documentation

## 2019-03-17 DIAGNOSIS — R112 Nausea with vomiting, unspecified: Secondary | ICD-10-CM | POA: Insufficient documentation

## 2019-03-17 DIAGNOSIS — F17290 Nicotine dependence, other tobacco product, uncomplicated: Secondary | ICD-10-CM | POA: Insufficient documentation

## 2019-03-17 DIAGNOSIS — O219 Vomiting of pregnancy, unspecified: Secondary | ICD-10-CM

## 2019-03-17 DIAGNOSIS — R1032 Left lower quadrant pain: Secondary | ICD-10-CM | POA: Insufficient documentation

## 2019-03-17 DIAGNOSIS — R1110 Vomiting, unspecified: Secondary | ICD-10-CM

## 2019-03-17 DIAGNOSIS — Z3A01 Less than 8 weeks gestation of pregnancy: Secondary | ICD-10-CM | POA: Insufficient documentation

## 2019-03-17 LAB — URINALYSIS, COMPLETE (UACMP) WITH MICROSCOPIC
Bilirubin Urine: NEGATIVE
Glucose, UA: NEGATIVE mg/dL
Hgb urine dipstick: NEGATIVE
Ketones, ur: NEGATIVE mg/dL
Leukocytes,Ua: NEGATIVE
Nitrite: NEGATIVE
Protein, ur: NEGATIVE mg/dL
Specific Gravity, Urine: 1.021 (ref 1.005–1.030)
pH: 7 (ref 5.0–8.0)

## 2019-03-17 LAB — CBC WITH DIFFERENTIAL/PLATELET
Abs Immature Granulocytes: 0.03 10*3/uL (ref 0.00–0.07)
Basophils Absolute: 0.1 10*3/uL (ref 0.0–0.1)
Basophils Relative: 1 %
Eosinophils Absolute: 0.1 10*3/uL (ref 0.0–0.5)
Eosinophils Relative: 1 %
HCT: 43.6 % (ref 36.0–46.0)
Hemoglobin: 14.8 g/dL (ref 12.0–15.0)
Immature Granulocytes: 0 %
Lymphocytes Relative: 19 %
Lymphs Abs: 1.9 10*3/uL (ref 0.7–4.0)
MCH: 29.8 pg (ref 26.0–34.0)
MCHC: 33.9 g/dL (ref 30.0–36.0)
MCV: 87.7 fL (ref 80.0–100.0)
Monocytes Absolute: 0.8 10*3/uL (ref 0.1–1.0)
Monocytes Relative: 8 %
Neutro Abs: 7.1 10*3/uL (ref 1.7–7.7)
Neutrophils Relative %: 71 %
Platelets: 278 10*3/uL (ref 150–400)
RBC: 4.97 MIL/uL (ref 3.87–5.11)
RDW: 13 % (ref 11.5–15.5)
WBC: 9.9 10*3/uL (ref 4.0–10.5)
nRBC: 0 % (ref 0.0–0.2)

## 2019-03-17 LAB — COMPREHENSIVE METABOLIC PANEL
ALT: 21 U/L (ref 0–44)
AST: 17 U/L (ref 15–41)
Albumin: 4.5 g/dL (ref 3.5–5.0)
Alkaline Phosphatase: 49 U/L (ref 38–126)
Anion gap: 8 (ref 5–15)
BUN: 5 mg/dL — ABNORMAL LOW (ref 6–20)
CO2: 24 mmol/L (ref 22–32)
Calcium: 9.3 mg/dL (ref 8.9–10.3)
Chloride: 104 mmol/L (ref 98–111)
Creatinine, Ser: 0.6 mg/dL (ref 0.44–1.00)
GFR calc Af Amer: >60 mL/min (ref >60)
GFR calc non Af Amer: >60 mL/min (ref >60)
Glucose, Bld: 98 mg/dL (ref 70–99)
Potassium: 3.5 mmol/L (ref 3.5–5.1)
Sodium: 136 mmol/L (ref 135–145)
Total Bilirubin: 0.8 mg/dL (ref 0.3–1.2)
Total Protein: 7.6 g/dL (ref 6.5–8.1)

## 2019-03-17 LAB — WET PREP, GENITAL
Clue Cells Wet Prep HPF POC: NONE SEEN
Sperm: NONE SEEN
Trich, Wet Prep: NONE SEEN
Yeast Wet Prep HPF POC: NONE SEEN

## 2019-03-17 LAB — POCT PREGNANCY, URINE: Preg Test, Ur: POSITIVE — AB

## 2019-03-17 LAB — HCG, QUANTITATIVE, PREGNANCY: hCG, Beta Chain, Quant, S: 50334 m[IU]/mL — ABNORMAL HIGH (ref <5)

## 2019-03-17 LAB — LIPASE, BLOOD: Lipase: 23 U/L (ref 11–51)

## 2019-03-17 MED ORDER — LACTATED RINGERS IV BOLUS
1000.0000 mL | Freq: Once | INTRAVENOUS | Status: AC
  Administered 2019-03-17: 09:00:00 1000 mL via INTRAVENOUS

## 2019-03-17 MED ORDER — ONDANSETRON 4 MG PO TBDP: 4.0000 mg | ORAL_TABLET | Freq: Three times a day (TID) | ORAL | 0 refills | Status: DC | PRN

## 2019-03-17 MED ORDER — ONDANSETRON HCL 4 MG/2ML IJ SOLN
4.0000 mg | Freq: Once | INTRAMUSCULAR | Status: AC
  Administered 2019-03-17: 09:00:00 4 mg via INTRAVENOUS
  Filled 2019-03-17: qty 2

## 2019-03-17 NOTE — ED Triage Notes (Signed)
C/O LLQ abdominal pain and vomiting.  Patient had similar symptoms one week ago but symptoms worsened last night.

## 2019-03-17 NOTE — ED Provider Notes (Signed)
[facility] Emergency Department Provider Note   ____________________________________________   First MD Initiated Contact with Patient 03/17/19 0818     (approximate)  I have reviewed the triage vital signs and the nursing notes.   HISTORY  Chief Complaint Abdominal Pain    HPI Robin Roman is a 24 y.o. female with no significant past medical history who presents to the ED complaining of abdominal pain and vomiting. Patient reports approximately 1 week of intermittent left lower quadrant pain as well as frequent episodes of vomiting. She states that whenever she goes to eat something, she would develop pain as well as vomiting about 20 min afterwards. She will vomit anywhere from 3-5 times per day and has had a couple episodes of diarrhea. She has had occasional cramping pain in her left lower quadrant that seem to be worse last night, although pain is currently minimal. She has not had any fevers, chills, cough, chest pain, or shortness of breath. She denies any dysuria, hematuria, vaginal bleeding, or vaginal discharge. Her LMP was Dec 4 and she does admit it is possible she could be pregnant.        Past Medical History:  Diagnosis Date  . Medical history non-contributory     Patient Active Problem List   Diagnosis Date Noted  . Back pain affecting pregnancy in third trimester 02/11/2015  . Preterm labor 02/11/2015  . Decreased fetal movement 01/03/2015    Past Surgical History:  Procedure Laterality Date  . NO PAST SURGERIES      Prior to Admission medications   Medication Sig Start Date End Date Taking? Authorizing Provider  fluconazole (DIFLUCAN) 150 MG tablet Take 1 tablet (150 mg total) by mouth daily. 06/24/17   Tommi Rumps, PA-C  ibuprofen (ADVIL,MOTRIN) 800 MG tablet Take 1 tablet (800 mg total) by mouth every 8 (eight) hours as needed. 08/21/17   Menshew, Charlesetta Ivory, PA-C  ondansetron (ZOFRAN ODT) 4 MG disintegrating tablet  Take 1 tablet (4 mg total) by mouth every 8 (eight) hours as needed for nausea or vomiting. 03/17/19   Chesley Noon, MD    Allergies Patient has no known allergies.  No family history on file.  Social History Social History   Tobacco Use  . Smoking status: Current Some Day Smoker    Packs/day: 1.00    Types: Cigars  . Smokeless tobacco: Never Used  Substance Use Topics  . Alcohol use: No  . Drug use: No    Review of Systems  Constitutional: No fever/chills Eyes: No visual changes. ENT: No sore throat. Cardiovascular: Denies chest pain. Respiratory: Denies shortness of breath. Gastrointestinal: Positive for abdominal pain. Positive for nausea, vomiting, and diarrhea.  No constipation. Genitourinary: Negative for dysuria. Musculoskeletal: Negative for back pain. Skin: Negative for rash. Neurological: Negative for headaches, focal weakness or numbness.  ____________________________________________   PHYSICAL EXAM:  VITAL SIGNS: ED Triage Vitals  Enc Vitals Group     BP 03/17/19 0816 (!) 142/91     Pulse Rate 03/17/19 0816 (!) 55     Resp 03/17/19 0816 18     Temp 03/17/19 0816 98.4 F (36.9 C)     Temp Source 03/17/19 0816 Oral     SpO2 03/17/19 0816 100 %     Weight 03/17/19 0813 199 lb 15.3 oz (90.7 kg)     Height 03/17/19 0813 5\' 7"  (1.702 m)     Head Circumference --      Peak Flow --  Pain Score 03/17/19 0812 8     Pain Loc --      Pain Edu? --      Excl. in GC? --     Constitutional: Alert and oriented. Eyes: Conjunctivae are normal. Head: Atraumatic. Nose: No congestion/rhinnorhea. Mouth/Throat: Mucous membranes are moist. Neck: Normal ROM Cardiovascular: Normal rate, regular rhythm. Grossly normal heart sounds. Respiratory: Normal respiratory effort.  No retractions. Lungs CTAB. Gastrointestinal: Soft and nontender. No distention. Genitourinary: Cervical os closed with no bleeding noted.  No cervical motion or next tenderness.  Musculoskeletal: No lower extremity tenderness nor edema. Neurologic:  Normal speech and language. No gross focal neurologic deficits are appreciated. Skin:  Skin is warm, dry and intact. No rash noted. Psychiatric: Mood and affect are normal. Speech and behavior are normal.  ____________________________________________   LABS (all labs ordered are listed, but only abnormal results are displayed)  Labs Reviewed  WET PREP, GENITAL - Abnormal; Notable for the following components:      Result Value   WBC, Wet Prep HPF POC MANY (*)    All other components within normal limits  URINALYSIS, COMPLETE (UACMP) WITH MICROSCOPIC - Abnormal; Notable for the following components:   Color, Urine YELLOW (*)    APPearance CLEAR (*)    Bacteria, UA RARE (*)    All other components within normal limits  COMPREHENSIVE METABOLIC PANEL - Abnormal; Notable for the following components:   BUN 5 (*)    All other components within normal limits  HCG, QUANTITATIVE, PREGNANCY - Abnormal; Notable for the following components:   hCG, Beta Chain, Quant, S 50,334 (*)    All other components within normal limits  POCT PREGNANCY, URINE - Abnormal; Notable for the following components:   Preg Test, Ur POSITIVE (*)    All other components within normal limits  CBC WITH DIFFERENTIAL/PLATELET  LIPASE, BLOOD  POC URINE PREG, ED     PROCEDURES  Procedure(s) performed (including Critical Care):  Procedures   ____________________________________________   INITIAL IMPRESSION / ASSESSMENT AND PLAN / ED COURSE       24 year old female presents to the ED with 1 week of intermittent vomiting, diarrhea, and crampy left lower quadrant abdominal pain. Her LMP was Dec 4 and we will check pregnancy testing. Abdominal exam is benign with no focal tenderness at this time, will screen labs including LFTs and lipase but I would be suspicious for a gastroenteritis. No right lower quadrant tenderness to suggest  appendicitis and ovarian torsion seems less likely. Plan to perform pelvic exam to assess for cervicitis, PID, or other concerning pathology.  Pelvic exam unremarkable.  Pregnancy testing is positive and ultrasound shows intrauterine pregnancy at approximately 7 weeks.  No bleeding to necessitate RhoGam.  Nausea and pain are well controlled at this time, will prescribe Zofran for use as needed and counseled patient to use Tylenol.  Patient provided with referral to establish care with OB/GYN, counseled to return to the ED for new or worsening symptoms.  Patient agrees with plan.      ____________________________________________   FINAL CLINICAL IMPRESSION(S) / ED DIAGNOSES  Final diagnoses:  Abdominal pain during pregnancy in first trimester  Nausea and vomiting in pregnancy     ED Discharge Orders         Ordered    ondansetron (ZOFRAN ODT) 4 MG disintegrating tablet  Every 8 hours PRN     03/17/19 1051           Note:  This document was  prepared using Systems analyst and may include unintentional dictation errors.   Blake Divine, MD 03/17/19 [phone]

## 2019-04-20 DIAGNOSIS — O0992 Supervision of high risk pregnancy, unspecified, second trimester: Secondary | ICD-10-CM

## 2019-04-20 HISTORY — DX: Supervision of high risk pregnancy, unspecified, second trimester: O09.92

## 2019-06-02 ENCOUNTER — Emergency Department
Admission: EM | Admit: 2019-06-02 | Discharge: 2019-06-03 | Disposition: A | Discharge status: Home / Self Care | Payer: Medicaid Other | Attending: Emergency Medicine | Admitting: Emergency Medicine

## 2019-06-02 ENCOUNTER — Other Ambulatory Visit: Payer: Self-pay

## 2019-06-02 DIAGNOSIS — Z5321 Procedure and treatment not carried out due to patient leaving prior to being seen by health care provider: Secondary | ICD-10-CM | POA: Diagnosis not present

## 2019-06-02 DIAGNOSIS — O26899 Other specified pregnancy related conditions, unspecified trimester: Secondary | ICD-10-CM

## 2019-06-02 DIAGNOSIS — O26892 Other specified pregnancy related conditions, second trimester: Secondary | ICD-10-CM | POA: Diagnosis present

## 2019-06-02 DIAGNOSIS — R109 Unspecified abdominal pain: Secondary | ICD-10-CM

## 2019-06-02 DIAGNOSIS — Z3A19 19 weeks gestation of pregnancy: Secondary | ICD-10-CM | POA: Diagnosis not present

## 2019-06-02 NOTE — ED Triage Notes (Addendum)
Pt to the er for abd pain that started at 0600 that is constant and lower abd upper abd and back. Pt denies vaginal bleeding. Pt is currently 19 weeks and 2 days. Pt last pregnancy was premature at 28 weeks.

## 2019-06-03 ENCOUNTER — Emergency Department: Payer: Medicaid Other

## 2019-06-03 LAB — URINALYSIS, COMPLETE (UACMP) WITH MICROSCOPIC
Bilirubin Urine: NEGATIVE
Glucose, UA: NEGATIVE mg/dL
Hgb urine dipstick: NEGATIVE
Ketones, ur: NEGATIVE mg/dL
Nitrite: NEGATIVE
Protein, ur: 30 mg/dL — AB
Specific Gravity, Urine: 1.029 (ref 1.005–1.030)
pH: 5 (ref 5.0–8.0)

## 2019-06-03 LAB — COMPREHENSIVE METABOLIC PANEL
ALT: 17 U/L (ref 0–44)
AST: 14 U/L — ABNORMAL LOW (ref 15–41)
Albumin: 3.7 g/dL (ref 3.5–5.0)
Alkaline Phosphatase: 43 U/L (ref 38–126)
Anion gap: 8 (ref 5–15)
BUN: 5 mg/dL — ABNORMAL LOW (ref 6–20)
CO2: 22 mmol/L (ref 22–32)
Calcium: 9.1 mg/dL (ref 8.9–10.3)
Chloride: 106 mmol/L (ref 98–111)
Creatinine, Ser: 0.43 mg/dL — ABNORMAL LOW (ref 0.44–1.00)
GFR calc Af Amer: >60 mL/min (ref >60)
GFR calc non Af Amer: >60 mL/min (ref >60)
Glucose, Bld: 84 mg/dL (ref 70–99)
Potassium: 3.6 mmol/L (ref 3.5–5.1)
Sodium: 136 mmol/L (ref 135–145)
Total Bilirubin: 0.5 mg/dL (ref 0.3–1.2)
Total Protein: 6.8 g/dL (ref 6.5–8.1)

## 2019-06-03 LAB — CBC
HCT: 36.2 % (ref 36.0–46.0)
Hemoglobin: 12.6 g/dL (ref 12.0–15.0)
MCH: 30.6 pg (ref 26.0–34.0)
MCHC: 34.8 g/dL (ref 30.0–36.0)
MCV: 87.9 fL (ref 80.0–100.0)
Platelets: 259 10*3/uL (ref 150–400)
RBC: 4.12 MIL/uL (ref 3.87–5.11)
RDW: 12.5 % (ref 11.5–15.5)
WBC: 11 10*3/uL — ABNORMAL HIGH (ref 4.0–10.5)
nRBC: 0 % (ref 0.0–0.2)

## 2019-06-03 LAB — URIC ACID: Uric Acid, Serum: 3.9 mg/dL (ref 2.5–7.1)

## 2019-06-03 LAB — HCG, QUANTITATIVE, PREGNANCY: hCG, Beta Chain, Quant, S: 17659 m[IU]/mL — ABNORMAL HIGH (ref <5)

## 2019-06-03 LAB — LIPASE, BLOOD: Lipase: 25 U/L (ref 11–51)

## 2019-06-03 NOTE — ED Notes (Signed)
Pt called L&D to get her lab results. She was told to ask me., I told pt I cant interpret results and give. Pt states she aint waiting.

## 2019-09-11 ENCOUNTER — Observation Stay
Admission: EM | Admit: 2019-09-11 | Discharge: 2019-09-11 | Disposition: A | Discharge status: Home / Self Care | Payer: Medicaid Other | Attending: Obstetrics and Gynecology | Admitting: Obstetrics and Gynecology

## 2019-09-11 ENCOUNTER — Other Ambulatory Visit: Payer: Self-pay

## 2019-09-11 DIAGNOSIS — O26893 Other specified pregnancy related conditions, third trimester: Principal | ICD-10-CM | POA: Insufficient documentation

## 2019-09-11 DIAGNOSIS — O163 Unspecified maternal hypertension, third trimester: Secondary | ICD-10-CM

## 2019-09-11 DIAGNOSIS — O1203 Gestational edema, third trimester: Secondary | ICD-10-CM | POA: Insufficient documentation

## 2019-09-11 DIAGNOSIS — R030 Elevated blood-pressure reading, without diagnosis of hypertension: Secondary | ICD-10-CM | POA: Insufficient documentation

## 2019-09-11 DIAGNOSIS — R519 Headache, unspecified: Secondary | ICD-10-CM | POA: Diagnosis not present

## 2019-09-11 DIAGNOSIS — Z3A33 33 weeks gestation of pregnancy: Secondary | ICD-10-CM | POA: Insufficient documentation

## 2019-09-11 DIAGNOSIS — O26849 Uterine size-date discrepancy, unspecified trimester: Secondary | ICD-10-CM

## 2019-09-11 HISTORY — DX: Uterine size-date discrepancy, unspecified trimester: O26.849

## 2019-09-11 HISTORY — DX: Unspecified maternal hypertension, third trimester: O16.3

## 2019-09-11 LAB — URINALYSIS, COMPLETE (UACMP) WITH MICROSCOPIC
Bilirubin Urine: NEGATIVE
Glucose, UA: NEGATIVE mg/dL
Hgb urine dipstick: NEGATIVE
Ketones, ur: NEGATIVE mg/dL
Leukocytes,Ua: NEGATIVE
Nitrite: NEGATIVE
Protein, ur: NEGATIVE mg/dL
Specific Gravity, Urine: 1.025 (ref 1.005–1.030)
pH: 5 (ref 5.0–8.0)

## 2019-09-11 LAB — COMPREHENSIVE METABOLIC PANEL
ALT: 13 U/L (ref 0–44)
AST: 16 U/L (ref 15–41)
Albumin: 3.2 g/dL — ABNORMAL LOW (ref 3.5–5.0)
Alkaline Phosphatase: 86 U/L (ref 38–126)
Anion gap: 8 (ref 5–15)
BUN: 6 mg/dL (ref 6–20)
CO2: 24 mmol/L (ref 22–32)
Calcium: 9.1 mg/dL (ref 8.9–10.3)
Chloride: 106 mmol/L (ref 98–111)
Creatinine, Ser: 0.61 mg/dL (ref 0.44–1.00)
GFR calc Af Amer: >60 mL/min (ref >60)
GFR calc non Af Amer: >60 mL/min (ref >60)
Glucose, Bld: 103 mg/dL — ABNORMAL HIGH (ref 70–99)
Potassium: 3.2 mmol/L — ABNORMAL LOW (ref 3.5–5.1)
Sodium: 138 mmol/L (ref 135–145)
Total Bilirubin: 0.7 mg/dL (ref 0.3–1.2)
Total Protein: 6.5 g/dL (ref 6.5–8.1)

## 2019-09-11 LAB — CBC
HCT: 31.8 % — ABNORMAL LOW (ref 36.0–46.0)
Hemoglobin: 11 g/dL — ABNORMAL LOW (ref 12.0–15.0)
MCH: 30.6 pg (ref 26.0–34.0)
MCHC: 34.6 g/dL (ref 30.0–36.0)
MCV: 88.6 fL (ref 80.0–100.0)
Platelets: 235 10*3/uL (ref 150–400)
RBC: 3.59 MIL/uL — ABNORMAL LOW (ref 3.87–5.11)
RDW: 13 % (ref 11.5–15.5)
WBC: 11.2 10*3/uL — ABNORMAL HIGH (ref 4.0–10.5)
nRBC: 0 % (ref 0.0–0.2)

## 2019-09-11 LAB — PROTEIN / CREATININE RATIO, URINE
Creatinine, Urine: 212 mg/dL
Protein Creatinine Ratio: 0.08 mg/mg{Cre} (ref 0.00–0.15)
Total Protein, Urine: 16 mg/dL

## 2019-09-11 MED ORDER — CALCIUM CARBONATE ANTACID 500 MG PO CHEW
2.0000 | CHEWABLE_TABLET | ORAL | Status: DC | PRN
  Administered 2019-09-11: 400 mg via ORAL
  Filled 2019-09-11: qty 2

## 2019-09-11 MED ORDER — ACETAMINOPHEN 325 MG PO TABS
650.0000 mg | ORAL_TABLET | ORAL | Status: DC | PRN
  Administered 2019-09-11: 650 mg via ORAL
  Filled 2019-09-11: qty 2

## 2019-09-11 NOTE — Discharge Summary (Signed)
TRIAGE VISIT with NST   Robin Roman is a 24 y.o. G2P0101. She is at [redacted]w[redacted]d gestation.  Indication: Elevated Blood Pressure at home, headache  S: Resting comfortably. no CTX, no VB. Active fetal movement. Denies  RUQ pain, visual changes SOB. +new onset ankle edema.  O:  BP 124/74   Pulse 85   Temp 98.4 F (36.9 C) (Oral)   Resp 16   Ht 5\' 7"  (1.702 m)   Wt (!) 108.4 kg   LMP 01/20/2019   BMI 37.43 kg/m  Results for orders placed or performed during the hospital encounter of 09/11/19 (from the past 48 hour(s))  Protein / creatinine ratio, urine   Collection Time: 09/11/19  6:46 PM  Result Value Ref Range   Creatinine, Urine 212 mg/dL   Total Protein, Urine 16 mg/dL   Protein Creatinine Ratio 0.08 0.00 - 0.15 mg/mg[Cre]  Urinalysis, Complete w Microscopic   Collection Time: 09/11/19  6:46 PM  Result Value Ref Range   Color, Urine YELLOW (A) YELLOW   APPearance HAZY (A) CLEAR   Specific Gravity, Urine 1.025 1.005 - 1.030   pH 5.0 5.0 - 8.0   Glucose, UA NEGATIVE NEGATIVE mg/dL   Hgb urine dipstick NEGATIVE NEGATIVE   Bilirubin Urine NEGATIVE NEGATIVE   Ketones, ur NEGATIVE NEGATIVE mg/dL   Protein, ur NEGATIVE NEGATIVE mg/dL   Nitrite NEGATIVE NEGATIVE   Leukocytes,Ua NEGATIVE NEGATIVE   RBC / HPF 6-10 0 - 5 RBC/hpf   WBC, UA 0-5 0 - 5 WBC/hpf   Bacteria, UA RARE (A) NONE SEEN   Squamous Epithelial / LPF 0-5 0 - 5   Mucus PRESENT    Ca Oxalate Crys, UA PRESENT   CBC   Collection Time: 09/11/19  6:53 PM  Result Value Ref Range   WBC 11.2 (H) 4.0 - 10.5 K/uL   RBC 3.59 (L) 3.87 - 5.11 MIL/uL   Hemoglobin 11.0 (L) 12.0 - 15.0 g/dL   HCT 09/13/19 (L) 36 - 46 %   MCV 88.6 80.0 - 100.0 fL   MCH 30.6 26.0 - 34.0 pg   MCHC 34.6 30.0 - 36.0 g/dL   RDW 27.7 41.2 - 87.8 %   Platelets 235 150 - 400 K/uL   nRBC 0.0 0.0 - 0.2 %  Comprehensive metabolic panel   Collection Time: 09/11/19  6:53 PM  Result Value Ref Range   Sodium 138 135 - 145 mmol/L   Potassium 3.2 (L) 3.5  - 5.1 mmol/L   Chloride 106 98 - 111 mmol/L   CO2 24 22 - 32 mmol/L   Glucose, Bld 103 (H) 70 - 99 mg/dL   BUN 6 6 - 20 mg/dL   Creatinine, Ser 09/13/19 0.44 - 1.00 mg/dL   Calcium 9.1 8.9 - 7.20 mg/dL   Total Protein 6.5 6.5 - 8.1 g/dL   Albumin 3.2 (L) 3.5 - 5.0 g/dL   AST 16 15 - 41 U/L   ALT 13 0 - 44 U/L   Alkaline Phosphatase 86 38 - 126 U/L   Total Bilirubin 0.7 0.3 - 1.2 mg/dL   GFR calc non Af Amer >60 >60 mL/min   GFR calc Af Amer >60 >60 mL/min   Anion gap 8 5 - 15     Gen: NAD, AAOx3      Abd: FNTTP      Ext: Non-tender, trace edena Reflexes:   NST: Baseline: 130 Variability: Moderate Accelerations: 15x15 Decelerations: None  TOCO: quiet SVE:    NST:  Category I strip, see detailed evaluation above  A/P:  24 y.o. G2P0101 [redacted]w[redacted]d with normal pressures in triage, normal PreE labs, now resolved headache.                        Labor: not present.   R/o PreEclampsia: reassuring with serial vitals, labs  Fetal Wellbeing: Reassuring Cat 1 tracing.  D/c home stable, precautions reviewed, follow-up as scheduled.

## 2019-09-11 NOTE — OB Triage Note (Signed)
Pt is a G1 P0101 at [redacted]w[redacted]d that presents from ED with c/o Elevated blood pressures. Pt states "I took my blood pressure with a cuff at work and it was in the 200's. I also have a headache today." Pt states " My coworker said my feet were swollen." Pt rates Headache 8/10 scale but has not tried anything to relieve it. Pt states " I drank a bottle of today and ice chips." Pt denies Right upper quadrant pain and denies VB, LOF. Pt states positive FM today. Reflexes +2, no clonus, lungs clear on assessment. Initial BP 134/69 and cycling q 15 minutes. EFM applied and initial fht 125.

## 2019-09-11 NOTE — Discharge Instructions (Signed)

## 2019-11-27 DIAGNOSIS — B009 Herpesviral infection, unspecified: Secondary | ICD-10-CM | POA: Insufficient documentation

## 2020-02-17 HISTORY — PX: OTHER SURGICAL HISTORY: SHX169

## 2020-04-17 IMAGING — US US OB < 14 WEEKS - US OB TV
1 series · 14 of 28 positions shown · non-contrast
Comparison: None.

CLINICAL DATA: Abdominal pain

EXAM:
OBSTETRIC <14 WK US AND TRANSVAGINAL OB US
TECHNIQUE: Both transabdominal and transvaginal ultrasound examinations were
performed for complete evaluation of the gestation as well as the
maternal uterus, adnexal regions, and pelvic cul-de-sac.
Transvaginal technique was performed to assess early pregnancy.

[Series 1: us ob < 14 weeks - us ob tv · 14 of 45 slices shown]
[im 2/45]
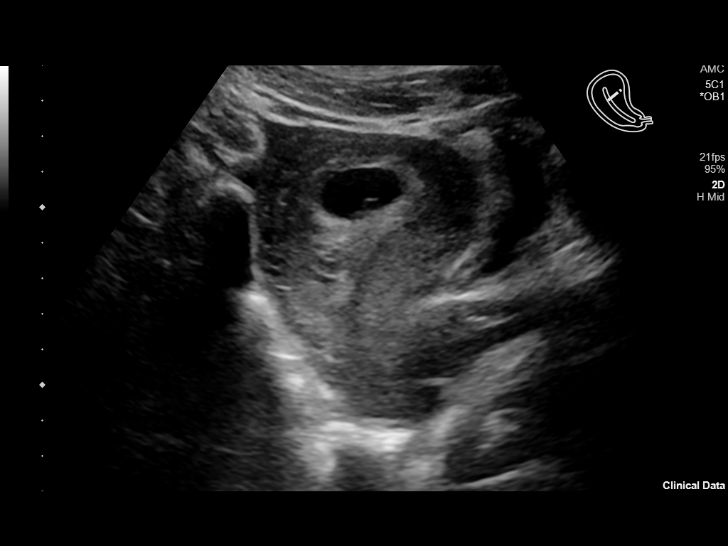
[im 5/45]
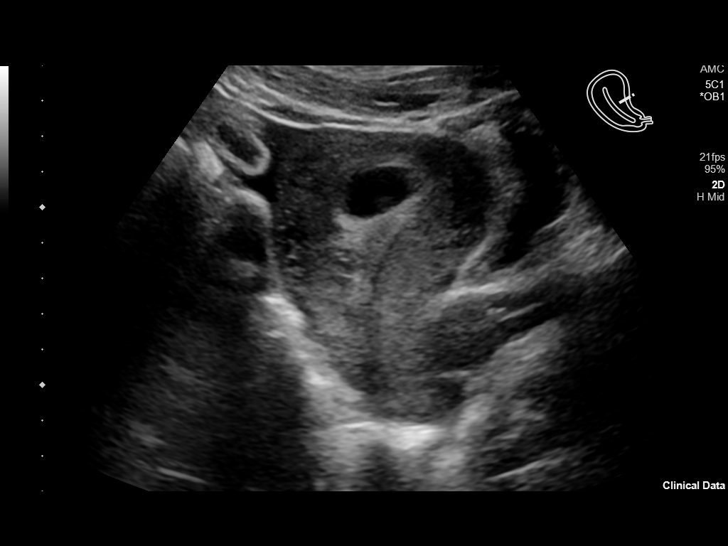
[im 9/45]
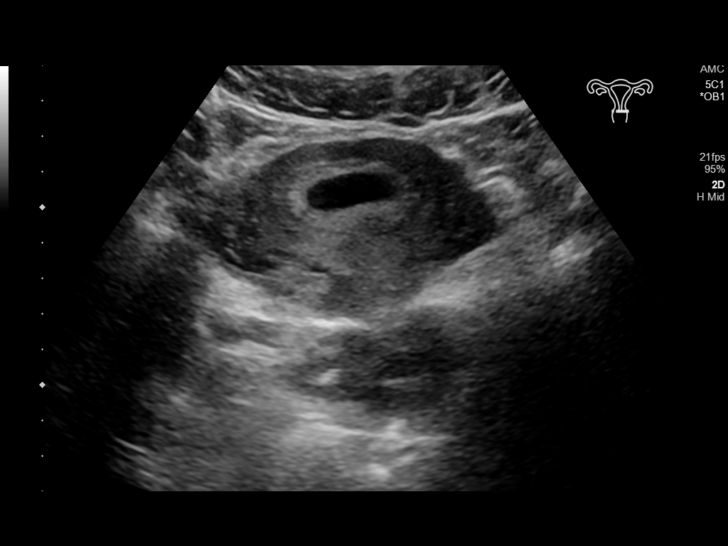
[im 12/45]
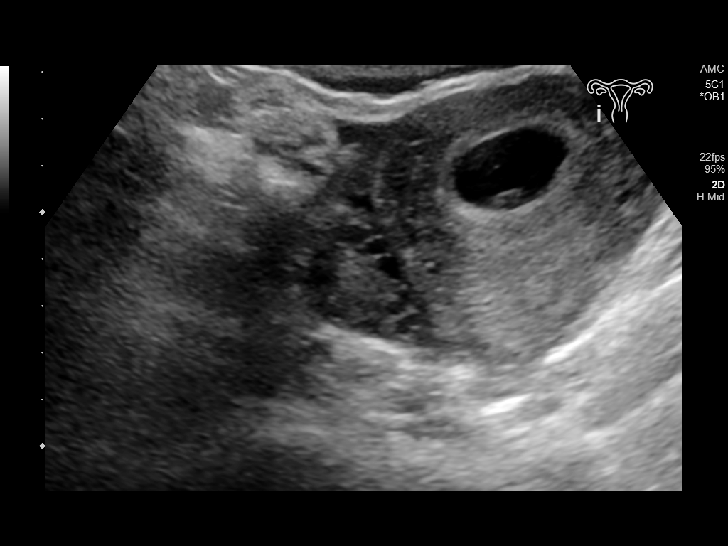
[im 15/45]
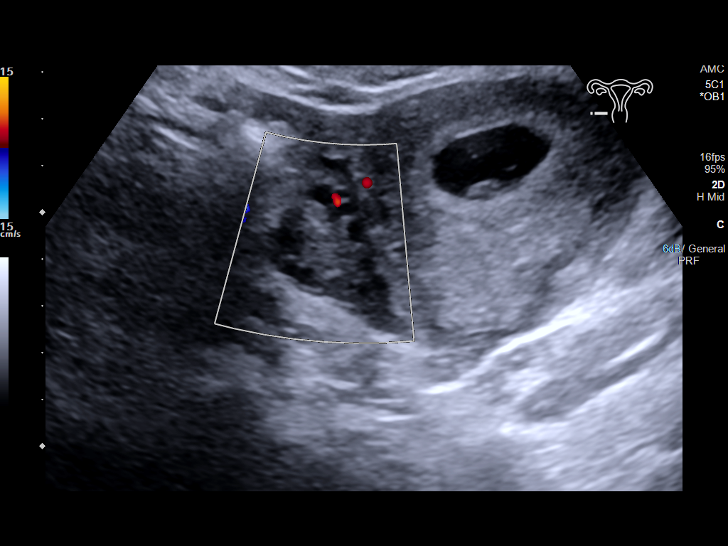
[im 18/45]
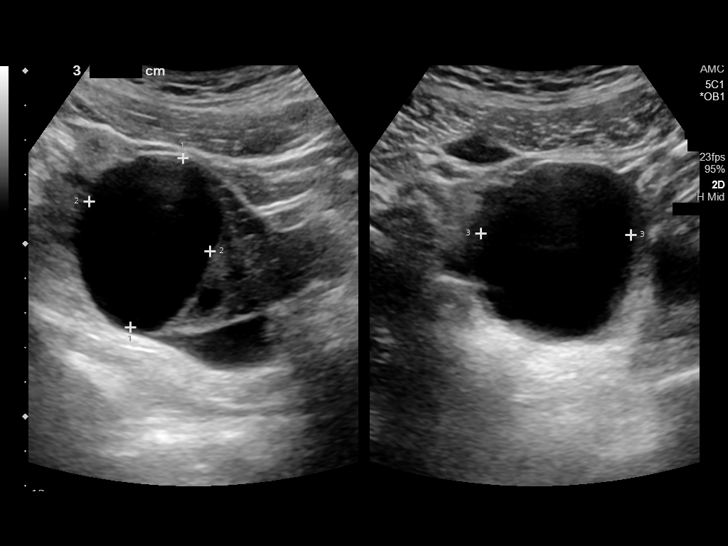
[im 22/45]
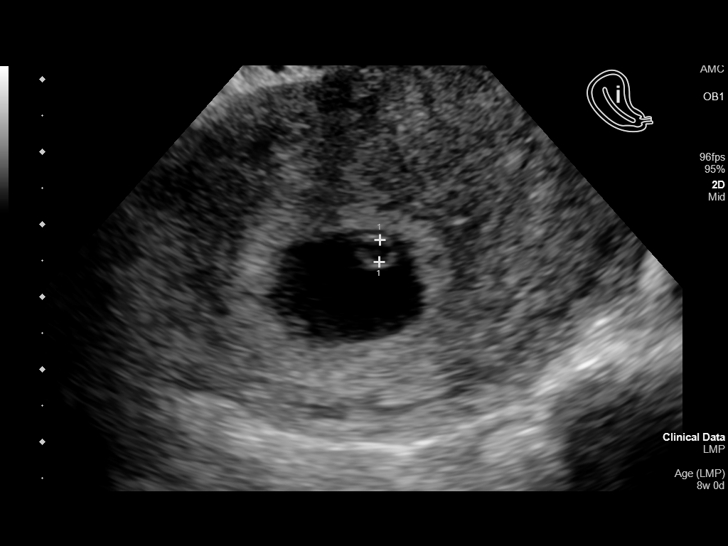
[im 25/45]
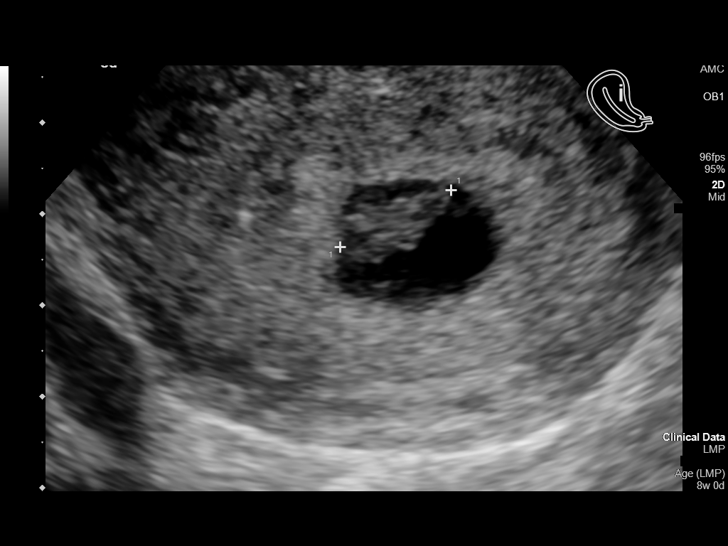
[im 28/45]
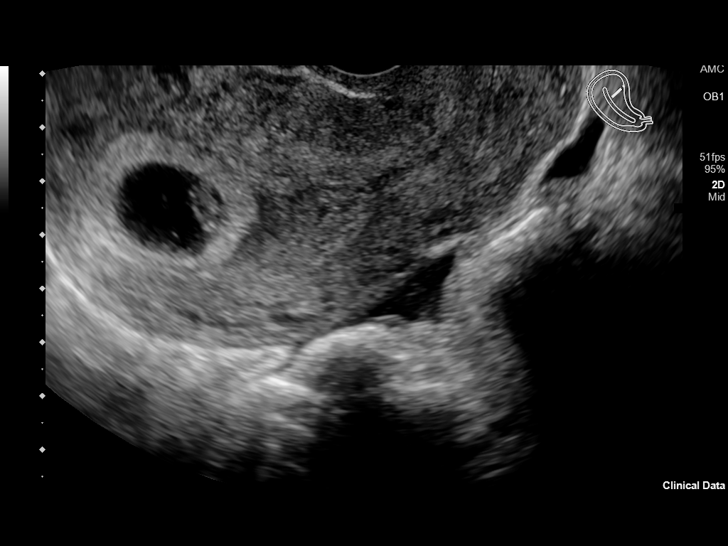
[im 31/45]
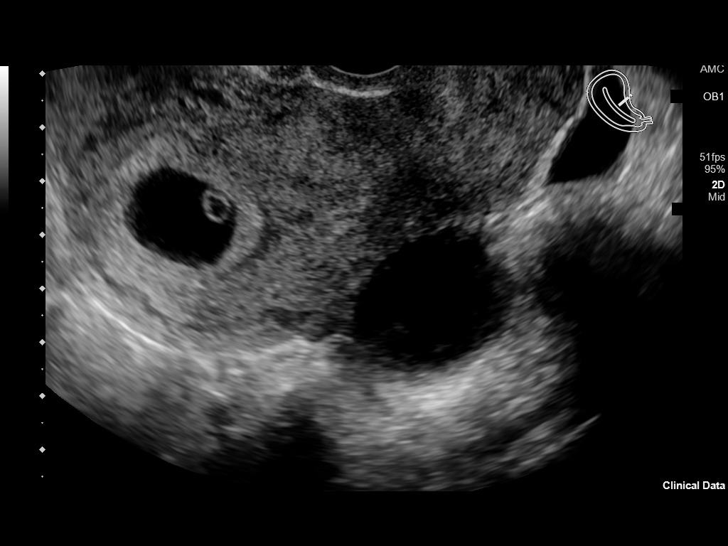
[im 35/45]
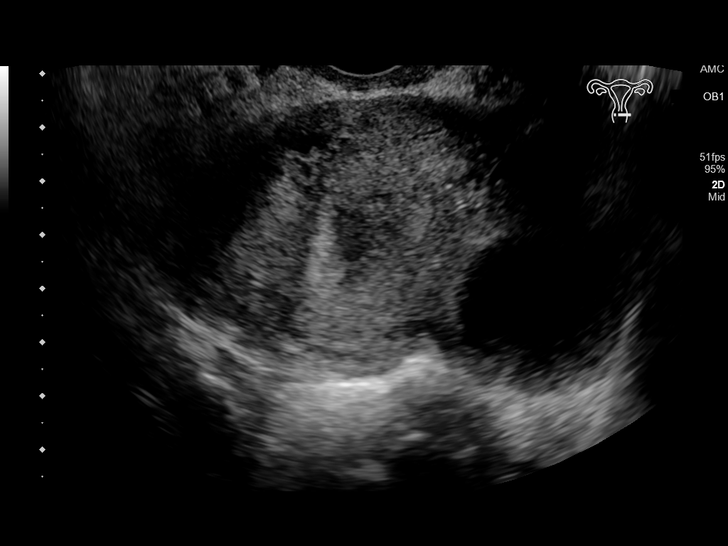
[im 38/45]
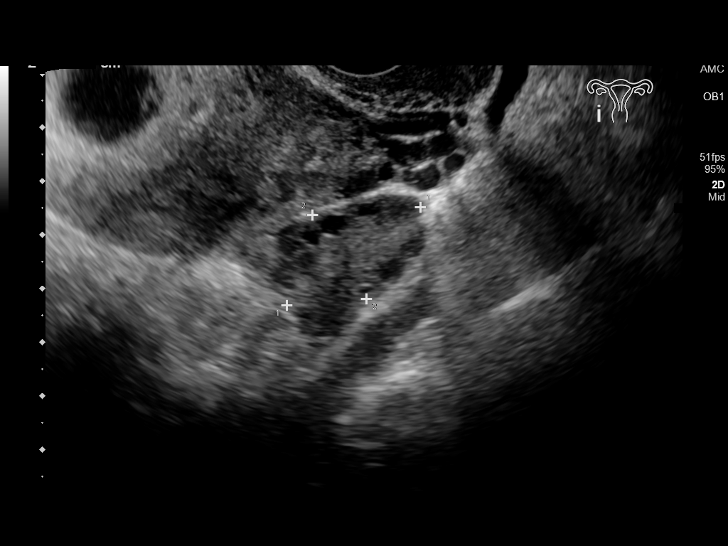
[im 41/45]
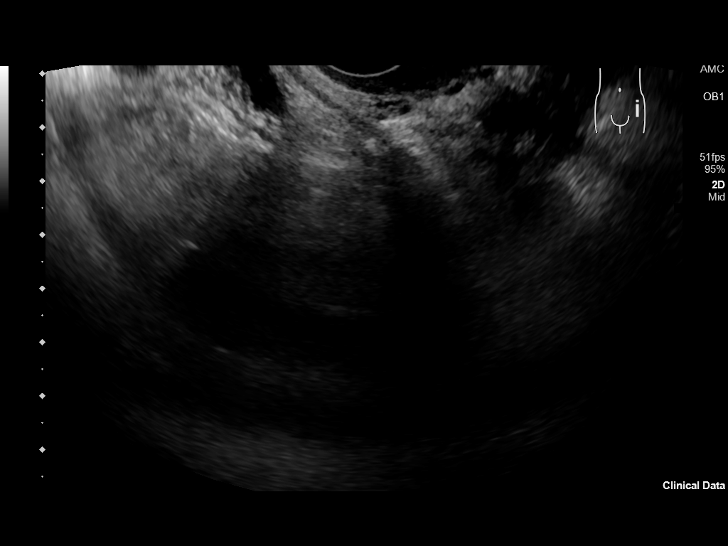
[im 45/45]
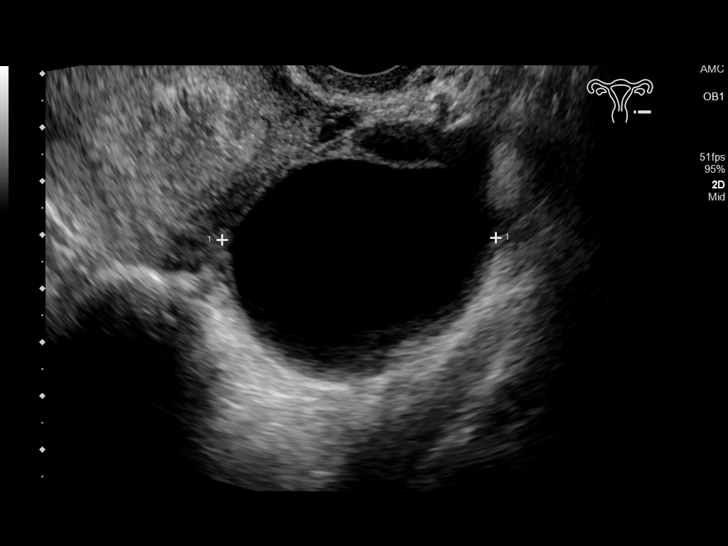

[14 of 28 positions shown; findings below may reference images not displayed]

FINDINGS: Intrauterine gestational sac: Single

Yolk sac:  Visualized.

Embryo:  Visualized.

Cardiac Activity: Visualized.

Heart Rate: 150 bpm

CRL:  13.5 mm   7 w   4 d                  US EDC: 10/30/2019

Subchorionic hemorrhage:  None visualized.

Maternal uterus/adnexae: 5.5 x 4.0 x 5.0 cm simple left ovarian
cyst. Right ovary is unremarkable. Trace fluid within the
cul-de-sac.
IMPRESSION: 1. Single live intrauterine gestation measuring 7 weeks 4 days by
crown-rump length.
2. Active embryonic heart tones at 150 beats per minute.
3. 5.5 cm simple left ovarian cyst.  Attention on follow-up.

## 2020-11-26 ENCOUNTER — Other Ambulatory Visit: Payer: Self-pay

## 2020-11-26 ENCOUNTER — Other Ambulatory Visit: Payer: Medicaid Other

## 2020-11-26 ENCOUNTER — Ambulatory Visit: Payer: Medicaid Other | Admitting: Physician Assistant

## 2020-11-26 DIAGNOSIS — Z113 Encounter for screening for infections with a predominantly sexual mode of transmission: Secondary | ICD-10-CM | POA: Diagnosis not present

## 2020-11-26 DIAGNOSIS — Z7189 Other specified counseling: Secondary | ICD-10-CM

## 2020-11-26 DIAGNOSIS — F419 Anxiety disorder, unspecified: Secondary | ICD-10-CM

## 2020-11-26 LAB — WET PREP FOR TRICH, YEAST, CLUE
Trichomonas Exam: NEGATIVE
Yeast Exam: NEGATIVE

## 2020-11-26 LAB — HM HIV SCREENING LAB: HM HIV Screening: NEGATIVE

## 2020-11-27 ENCOUNTER — Encounter: Payer: Self-pay | Admitting: Physician Assistant

## 2020-11-27 NOTE — Progress Notes (Signed)
[facility] Department STI clinic/screening visit  Subjective:  Robin Roman is a 25 y.o. female being seen today for an STI screening visit. The patient reports they do not have symptoms.  Patient reports that they do not desire a pregnancy in the next year.   They reported they are not interested in discussing contraception today.  No LMP recorded.   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  Denies chronic conditions, surgeries and regular medicines.  LMP was 10/29/2020 and using abstinence as BCM.  States last HIV test was in 2021 and last pap was in 2021.    See flowsheet for further details and programmatic requirements.    The following portions of the patient's history were reviewed and updated as appropriate: allergies, current medications, past medical history, past social history, past surgical history and problem list.  Objective:  There were no vitals filed for this visit.  Physical Exam Constitutional:      General: She is not in acute distress.    Appearance: Normal appearance.  HENT:     Head: Normocephalic and atraumatic.     Comments: No nits,lice, or hair loss. No cervical, supraclavicular or axillary adenopathy.     Mouth/Throat:     Mouth: Mucous membranes are moist.     Pharynx: Oropharynx is clear.  No oropharyngeal exudate or posterior oropharyngeal erythema.  Eyes:     Conjunctiva/sclera: Conjunctivae normal.  Pulmonary:     Effort: Pulmonary effort is normal.  Abdominal:     Palpations: Abdomen is soft. There is no mass.     Tenderness: There is no abdominal tenderness. There is no guarding or rebound.  Genitourinary:    General: Normal vulva.     Rectum: Normal.     Comments: External genitalia/pubic area without nits, lice, edema, erythema, lesions and inguinal adenopathy. Vagina with normal mucosa and discharge. Cervix without visible lesions. Uterus firm, mobile, nt, no masses, no CMT, no adnexal tenderness or fullness.  Musculoskeletal:     Cervical back: Neck supple. No tenderness.  Skin:    General: Skin is warm and dry.     Findings: No bruising, erythema, lesion or rash.  Neurological:     Mental Status: She is alert and oriented to person, place, and time.  Psychiatric:        Mood and Affect: Mood normal.        Behavior: Behavior normal.        Thought Content: Thought content normal.        Judgment: Judgment normal.     Assessment and Plan:  Robin Roman is a 25 y.o. female presenting to the [facility] Department for STI screening  1. Screening for STD (sexually transmitted disease) Patient into clinic without symptoms. Reviewed with patient that wet mount is normal and no treatment is indicated  today. Rec condoms with all sex. Await test results.  Counseled that RN will call if needs to RTC for treatment once results are back.  - WET PREP FOR TRICH, YEAST, CLUE - Chlamydia/Gonorrhea [place] [facility] - HIV [place] [facility] - Syphilis Serology, [place] [facility]  2. Anxiety Patient reports symptoms of anxiety and requests referral to LCSW for evaluation and counseling. LCSW information given and consent signed. - Ambulatory referral to Behavioral Health  3. Other specified counseling Patient with concerns re: "boils" on her skin in underarms,  under breasts and in groin/pubic area. Counseled patient re: keeping these areas as dry as possible and using antibacterial cleanser in those areas 2-3 times a week to reduce bacteria count on skin surface to reduce boils.     No follow-ups on file.  No future appointments.  Matt Holmes, PA

## 2020-12-16 ENCOUNTER — Other Ambulatory Visit: Payer: Medicaid Other

## 2021-01-03 ENCOUNTER — Other Ambulatory Visit: Payer: Medicaid Other

## 2021-02-21 ENCOUNTER — Ambulatory Visit (LOCAL_COMMUNITY_HEALTH_CENTER): Payer: Self-pay

## 2021-02-21 ENCOUNTER — Other Ambulatory Visit: Payer: Self-pay

## 2021-02-21 ENCOUNTER — Other Ambulatory Visit: Payer: Medicaid Other

## 2021-02-21 DIAGNOSIS — Z111 Encounter for screening for respiratory tuberculosis: Secondary | ICD-10-CM

## 2021-02-24 ENCOUNTER — Ambulatory Visit (LOCAL_COMMUNITY_HEALTH_CENTER): Payer: Self-pay

## 2021-02-24 ENCOUNTER — Other Ambulatory Visit: Payer: Self-pay

## 2021-02-24 DIAGNOSIS — Z111 Encounter for screening for respiratory tuberculosis: Secondary | ICD-10-CM

## 2021-02-24 LAB — TB SKIN TEST
Induration: 0 mm
TB Skin Test: NEGATIVE

## 2021-03-11 ENCOUNTER — Ambulatory Visit: Payer: Medicaid Other

## 2021-07-09 ENCOUNTER — Ambulatory Visit (LOCAL_COMMUNITY_HEALTH_CENTER): Payer: Medicaid Other | Admitting: Nurse Practitioner

## 2021-07-09 ENCOUNTER — Encounter: Payer: Self-pay | Admitting: Nurse Practitioner

## 2021-07-09 VITALS — BP 134/84 | HR 76 | Ht 67.0 in | Wt 224.2 lb

## 2021-07-09 DIAGNOSIS — Z3042 Encounter for surveillance of injectable contraceptive: Secondary | ICD-10-CM | POA: Diagnosis not present

## 2021-07-09 DIAGNOSIS — Z113 Encounter for screening for infections with a predominantly sexual mode of transmission: Secondary | ICD-10-CM

## 2021-07-09 DIAGNOSIS — Z309 Encounter for contraceptive management, unspecified: Secondary | ICD-10-CM

## 2021-07-09 DIAGNOSIS — Z01419 Encounter for gynecological examination (general) (routine) without abnormal findings: Secondary | ICD-10-CM

## 2021-07-09 DIAGNOSIS — Z3009 Encounter for other general counseling and advice on contraception: Secondary | ICD-10-CM

## 2021-07-09 LAB — WET PREP FOR TRICH, YEAST, CLUE
Trichomonas Exam: NEGATIVE
Yeast Exam: NEGATIVE

## 2021-07-09 LAB — HEPATITIS B SURFACE ANTIGEN

## 2021-07-09 LAB — HM HIV SCREENING LAB: HM HIV Screening: NEGATIVE

## 2021-07-09 LAB — HM HEPATITIS C SCREENING LAB: HM Hepatitis Screen: NEGATIVE

## 2021-07-09 MED ORDER — MEDROXYPROGESTERONE ACETATE 150 MG/ML IM SUSP
150.0000 mg | INTRAMUSCULAR | Status: AC
  Administered 2021-07-09: 150 mg via INTRAMUSCULAR

## 2021-07-09 NOTE — Progress Notes (Signed)
[facility] [address] Main Number: [phone]    Family Planning Visit- Initial Visit  Subjective:  Robin Roman is a 26 y.o.  [phone]   being seen today for an initial annual visit and to discuss reproductive life planning.  The patient is currently using No Method - Other Reason for pregnancy prevention. Patient reports   does not want a pregnancy in the next year.     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  Chief Complaint  Patient presents with   Annual Exam    Patient reports to clinic today for a physical and to start on Depo.    Body mass index is 35.11 kg/m. - Patient is eligible for diabetes screening based on BMI and age 60>40?  not applicable HA1C ordered? not applicable  Patient reports 2  partner/s in last year. Desires STI screening?  Yes  Has patient been screened once for HCV in the past?  No  No results found for: HCVAB  Does the patient have current drug use (including MJ), have a partner with drug use, and/or has been incarcerated since last result? Yes  If yes-- Screen for HCV through [facility]   Does the patient meet criteria for HBV testing? Yes  Criteria:  -Household, sexual or needle sharing contact with HBV -History of drug use -HIV positive -Those with known Hep C   Health Maintenance Due  Topic Date Due   COVID-19 Vaccine (1) Never done   HPV VACCINES (2 - 2-dose series) 06/15/2012   Hepatitis C Screening  Never done   CHLAMYDIA SCREENING  02/11/2016   PAP-Cervical Cytology Screening  Never done   PAP SMEAR-Modifier  Never done    Review of Systems  Constitutional:  Negative for chills, fever, malaise/fatigue and weight loss.  HENT:  Negative for congestion, hearing loss and sore throat.   Eyes:   Negative for blurred vision, double vision and photophobia.  Respiratory:  Negative for shortness of breath.   Cardiovascular:  Negative for chest pain.  Gastrointestinal:  Negative for abdominal pain, blood in stool, constipation, diarrhea, heartburn, nausea and vomiting.  Genitourinary:  Negative for dysuria and frequency.  Musculoskeletal:  Negative for back pain, joint pain and neck pain.  Skin:  Negative for itching and rash.  Neurological:  Positive for headaches. Negative for dizziness and weakness.  Endo/Heme/Allergies:  Does not bruise/bleed easily.  Psychiatric/Behavioral:  Negative for depression, substance abuse and suicidal ideas.    The following portions of the patient's history were reviewed and updated as appropriate: allergies, current medications, past family history, past medical history, past social history, past surgical history and problem list. Problem list updated.   See flowsheet for other program required questions.  Objective:   Vitals:   07/09/21 1055  BP: 134/84  Pulse: 76  Weight: 224 lb 3.2 oz (101.7 kg)  Height: 5\' 7"  (1.702 m)    Physical Exam Constitutional:      Appearance: Normal appearance.  HENT:     Head: Normocephalic.     Right Ear: External ear normal.     Left Ear: External ear normal.     Nose: Nose normal.     Mouth/Throat:     Lips: Pink.     Mouth: Mucous membranes are moist.  Comments: No visible signs of dental caries  Eyes:     Pupils: Pupils are equal, round, and reactive to light.  Cardiovascular:     Rate and Rhythm: Normal rate and regular rhythm.  Pulmonary:     Effort: Pulmonary effort is normal.     Breath sounds: Normal breath sounds.  Chest:     Comments: Breasts:        Right: Normal. No swelling, mass, nipple discharge, skin change or tenderness.        Left: Normal. No swelling, mass, nipple discharge, skin change or tenderness.   Abdominal:     General: Abdomen is flat. Bowel sounds are normal.      Palpations: Abdomen is soft.  Genitourinary:    Comments: External genitalia/pubic area without nits, lice, edema, erythema, lesions and inguinal adenopathy. Healing laceration noted to pubic area, non tender patient states laceration occurred after shaving.   Vagina with normal mucosa and discharge. Cervix without visible lesions. Uterus firm, mobile, nt, no masses, no CMT, no adnexal tenderness or fullness. pH 4.5.  Musculoskeletal:     Cervical back: Full passive range of motion without pain, normal range of motion and neck supple.  Skin:    General: Skin is warm and dry.  Neurological:     Mental Status: She is alert and oriented to person, place, and time.  Psychiatric:        Attention and Perception: Attention normal.        Mood and Affect: Mood normal.        Speech: Speech normal.        Behavior: Behavior normal. Behavior is cooperative.      Assessment and Plan:  [name] is a 26 y.o. female presenting to the [facility] Department for an initial annual wellness/contraceptive visit  Contraception counseling: Reviewed options based on patient desire and reproductive life plan. Patient is interested in Hormonal Injection. This was provided to the patient today.   Risks, benefits, and typical effectiveness rates were reviewed.  Questions were answered.  Written information was also given to the patient to review.    The patient will follow up in  1 years for surveillance.  The patient was told to call with any further questions, or with any concerns about this method of contraception.  Emphasized use of condoms 100% of the time for STI prevention.  Need for ECP was assessed. ECP not provided last sex one month ago.     1. Family planning counseling -26 year old female in clinic  today for a physical, Depo, and STD screening. -ROS reviewed.  Patient with a history of migraine headaches.  Advised patient to be aware of triggers for migraine headaches.   Also encouraged patient to eat at least 6 small frequent meals high in protein and drink at least 6-8 bottles of water daily.   -Patient interested in Depo as a birth control method.   2. Well woman exam with routine gynecological exam -Normal well woman exam. -CBE today, next due 06/2024 -PAP performed today.  - IGP, rfx Aptima HPV ASCU  3. Surveillance for Depo-Provera contraception -Patient may have Depo 150 MG IM q11-13 weeks x 1 year.   - medroxyPROGESTERone (DEPO-PROVERA) injection 150 mg  4. Screening examination for venereal disease -STD screening today. -Patient accepted all screenings including vaginal CT/GC and bloodwork for HIV/RPR.  Patient meets criteria for HepB screening? Yes. Ordered? Yes Patient meets criteria for HepC screening? Yes. Ordered? Yes  Treat wet prep per standing order Discussed time line for [facility] results and that patient will be called with positive results and encouraged patient to call if she had not heard in 2 weeks.  Counseled to return or seek care for continued or worsening symptoms Recommended condom use with all sex  Patient is currently not using  contraception  to prevent pregnancy.  Patient desires Depo today.   - HIV/HCV [facility] - Syphilis Serology, [place] [facility] - HBV Antigen/Antibody [facility] - Chlamydia/Gonorrhea [place] [facility] - WET PREP FOR TRICH, YEAST, CLUE     Return in 11 weeks (on 09/24/2021) for Depo.    Glenna Fellows, FNP

## 2021-07-09 NOTE — Progress Notes (Signed)
Client tolerated Depo injection without complaint. Wet prep reviewed - negative results. Rich Number, RN

## 2021-07-17 LAB — IGP, RFX APTIMA HPV ASCU: PAP Smear Comment: 0

## 2021-09-04 ENCOUNTER — Encounter: Payer: Self-pay | Admitting: Emergency Medicine

## 2021-09-04 ENCOUNTER — Emergency Department
Admission: EM | Admit: 2021-09-04 | Discharge: 2021-09-04 | Disposition: A | Discharge status: Home / Self Care | Payer: Medicaid Other | Attending: Emergency Medicine | Admitting: Emergency Medicine

## 2021-09-04 ENCOUNTER — Emergency Department: Payer: Medicaid Other

## 2021-09-04 ENCOUNTER — Other Ambulatory Visit: Payer: Self-pay

## 2021-09-04 DIAGNOSIS — N9489 Other specified conditions associated with female genital organs and menstrual cycle: Secondary | ICD-10-CM | POA: Diagnosis not present

## 2021-09-04 DIAGNOSIS — R519 Headache, unspecified: Secondary | ICD-10-CM | POA: Diagnosis present

## 2021-09-04 DIAGNOSIS — G43901 Migraine, unspecified, not intractable, with status migrainosus: Secondary | ICD-10-CM | POA: Diagnosis not present

## 2021-09-04 LAB — CBC WITH DIFFERENTIAL/PLATELET
Abs Immature Granulocytes: 0.03 10*3/uL (ref 0.00–0.07)
Basophils Absolute: 0 10*3/uL (ref 0.0–0.1)
Basophils Relative: 0 %
Eosinophils Absolute: 0 10*3/uL (ref 0.0–0.5)
Eosinophils Relative: 0 %
HCT: 41.1 % (ref 36.0–46.0)
Hemoglobin: 13.6 g/dL (ref 12.0–15.0)
Immature Granulocytes: 0 %
Lymphocytes Relative: 10 %
Lymphs Abs: 0.7 10*3/uL (ref 0.7–4.0)
MCH: 28.3 pg (ref 26.0–34.0)
MCHC: 33.1 g/dL (ref 30.0–36.0)
MCV: 85.4 fL (ref 80.0–100.0)
Monocytes Absolute: 0.5 10*3/uL (ref 0.1–1.0)
Monocytes Relative: 7 %
Neutro Abs: 6 10*3/uL (ref 1.7–7.7)
Neutrophils Relative %: 83 %
Platelets: 183 10*3/uL (ref 150–400)
RBC: 4.81 MIL/uL (ref 3.87–5.11)
RDW: 13.7 % (ref 11.5–15.5)
WBC: 7.2 10*3/uL (ref 4.0–10.5)
nRBC: 0 % (ref 0.0–0.2)

## 2021-09-04 LAB — COMPREHENSIVE METABOLIC PANEL WITH GFR
ALT: 45 U/L — ABNORMAL HIGH (ref 0–44)
AST: 36 U/L (ref 15–41)
Albumin: 4.1 g/dL (ref 3.5–5.0)
Alkaline Phosphatase: 73 U/L (ref 38–126)
Anion gap: 7 (ref 5–15)
BUN: 6 mg/dL (ref 6–20)
CO2: 25 mmol/L (ref 22–32)
Calcium: 9.3 mg/dL (ref 8.9–10.3)
Chloride: 106 mmol/L (ref 98–111)
Creatinine, Ser: 0.77 mg/dL (ref 0.44–1.00)
GFR, Estimated: >60 mL/min
Glucose, Bld: 114 mg/dL — ABNORMAL HIGH (ref 70–99)
Potassium: 3.4 mmol/L — ABNORMAL LOW (ref 3.5–5.1)
Sodium: 138 mmol/L (ref 135–145)
Total Bilirubin: 0.5 mg/dL (ref 0.3–1.2)
Total Protein: 7.7 g/dL (ref 6.5–8.1)

## 2021-09-04 LAB — HCG, QUANTITATIVE, PREGNANCY: hCG, Beta Chain, Quant, S: <1 m[IU]/mL (ref <5)

## 2021-09-04 LAB — LIPASE, BLOOD: Lipase: 28 U/L (ref 11–51)

## 2021-09-04 MED ORDER — DIPHENHYDRAMINE HCL 50 MG/ML IJ SOLN
25.0000 mg | Freq: Once | INTRAMUSCULAR | Status: AC
  Administered 2021-09-04: 25 mg via INTRAVENOUS
  Filled 2021-09-04: qty 1

## 2021-09-04 MED ORDER — PROCHLORPERAZINE EDISYLATE 10 MG/2ML IJ SOLN
10.0000 mg | Freq: Once | INTRAMUSCULAR | Status: AC
  Administered 2021-09-04: 10 mg via INTRAVENOUS
  Filled 2021-09-04: qty 2

## 2021-09-04 MED ORDER — PROCHLORPERAZINE MALEATE 10 MG PO TABS: 10.0000 mg | ORAL_TABLET | Freq: Four times a day (QID) | ORAL | 0 refills | Status: AC | PRN

## 2021-09-04 MED ORDER — KETOROLAC TROMETHAMINE 30 MG/ML IJ SOLN
30.0000 mg | Freq: Once | INTRAMUSCULAR | Status: AC
  Administered 2021-09-04: 30 mg via INTRAMUSCULAR
  Filled 2021-09-04: qty 1

## 2021-09-04 NOTE — ED Triage Notes (Signed)
Patient c/o headache x 4 days. Reports hx of same. Emesis earlier today.

## 2021-09-04 NOTE — ED Provider Triage Note (Signed)
Emergency Medicine Provider Triage Evaluation Note  Robin Roman , a 26 y.o. female  was evaluated in triage.  Pt complains of frontal headache that radiates to her bilateral temples that is occurred for the past 4 days.  Patient does report a history of migraines but states that this headache is atypical for her both in intensity and duration.  Patient states that headache came on slowly and progressed in intensity gradually.  She denies blurry vision, dizziness or weakness in the upper and lower extremities.  She states that she has been taking Tylenol with little relief.  No fever or chills.  Review of Systems  Positive: Patient has headache.  Negative: No chest pain or abdominal pain.   Physical Exam  BP (!) 118/106 (BP Location: Left Arm)   Pulse 76   Temp 98.6 F (37 C) (Oral)   Resp 18   SpO2 100%  Gen:   Awake, no distress   Resp:  Normal effort  MSK:   Moves extremities without difficulty  Other:    Medical Decision Making  Medically screening exam initiated at 2:00 PM.  Appropriate orders placed.  Robin Roman was informed that the remainder of the evaluation will be completed by another provider, this initial triage assessment does not replace that evaluation, and the importance of remaining in the ED until their evaluation is complete.     Robin Roman, [place] 09/04/21 1401

## 2021-09-04 NOTE — ED Provider Notes (Signed)
[facility] Provider Note    Event Date/Time   First MD Initiated Contact with Patient 09/04/21 1509     (approximate)   History   Chief Complaint Headache   HPI  Robin Roman is a 26 y.o. female with past medical history of migraines who presents to the ED complaining of headache.  Patient reports that she has been dealing with gradually worsening bilateral headache over the past 3 days.  She describes it as a throbbing located at both temples and moving behind both eyes.  She has not had any associated fevers or neck stiffness.  She denies any associated vision changes, speech changes, numbness, or weakness.  Pain has been exacerbated by bright lights and she has been feeling nauseous at times, but has not vomited.  She has been taking Tylenol at home with partial relief.  She describes headache as similar to prior migraines but more severe.     Physical Exam   Triage Vital Signs: ED Triage Vitals  Enc Vitals Group     BP 09/04/21 1359 (!) 118/106     Pulse Rate 09/04/21 1359 76     Resp 09/04/21 1359 18     Temp 09/04/21 1359 98.6 F (37 C)     Temp Source 09/04/21 1359 Oral     SpO2 09/04/21 1359 100 %     Weight 09/04/21 1400 220 lb (99.8 kg)     Height 09/04/21 1400 5\' 7"  (1.702 m)     Head Circumference --      Peak Flow --      Pain Score 09/04/21 1401 8     Pain Loc --      Pain Edu? --      Excl. in GC? --     Most recent vital signs: Vitals:   09/04/21 1359  BP: (!) 118/106  Pulse: 76  Resp: 18  Temp: 98.6 F (37 C)  SpO2: 100%    Constitutional: Alert and oriented. Eyes: Conjunctivae are normal. Head: Atraumatic. Nose: No congestion/rhinnorhea. Mouth/Throat: Mucous membranes are moist.  Neck: Supple with no meningismus. Cardiovascular: Normal rate, regular rhythm. Grossly normal heart sounds.  2+ radial pulses bilaterally. Respiratory: Normal respiratory effort.  No retractions. Lungs CTAB. Gastrointestinal: Soft  and nontender. No distention. Musculoskeletal: No lower extremity tenderness nor edema.  Neurologic:  Normal speech and language. No gross focal neurologic deficits are appreciated.    ED Results / Procedures / Treatments   Labs (all labs ordered are listed, but only abnormal results are displayed) Labs Reviewed  COMPREHENSIVE METABOLIC PANEL - Abnormal; Notable for the following components:      Result Value   Potassium 3.4 (*)    Glucose, Bld 114 (*)    ALT 45 (*)    All other components within normal limits  CBC WITH DIFFERENTIAL/PLATELET  LIPASE, BLOOD  HCG, QUANTITATIVE, PREGNANCY  PREGNANCY, URINE   RADIOLOGY CT head reviewed and interpreted by me with no hemorrhage or midline shift.  PROCEDURES:  Critical Care performed: No  Procedures   MEDICATIONS ORDERED IN ED: Medications  ketorolac (TORADOL) 30 MG/ML injection 30 mg (30 mg Intramuscular Given 09/04/21 1655)  prochlorperazine (COMPAZINE) injection 10 mg (10 mg Intravenous Given 09/04/21 1739)  diphenhydrAMINE (BENADRYL) injection 25 mg (25 mg Intravenous Given 09/04/21 1739)     IMPRESSION / MDM / ASSESSMENT AND PLAN / ED COURSE  I reviewed the triage vital signs and the nursing notes.  26 y.o. female with past medical history of migraines presents to the ED with 3 days of gradually worsening headache affecting both temples and behind her eyes.  Patient's presentation is most consistent with severe exacerbation of chronic illness.  Differential diagnosis includes, but is not limited to, migraine headache, tension headache, SAH, meningitis.  Patient well-appearing and in no acute distress, vital signs remarkable for elevated blood pressure but otherwise reassuring.  She does not appear to have a history of hypertension, elevated BP may be due to pain.  CT head is negative for acute process and labs are reassuring with no significant anemia, leukocytosis, electrolyte abnormality, or  AKI.  LFTs are also within normal limits.  Pregnancy testing is pending, we will hold off on usual migraine cocktail given patient is here with her small children.  If pregnancy testing is negative plan to treat with Toradol.  Pregnancy testing negative, patient without any relief in headache following dose of Toradol.  She was treated with Compazine and Benadryl, now states that headache is improving.  She is appropriate for discharge home with PCP and neurology follow-up, was counseled to return to the ED for new worsening symptoms, patient agrees with plan.      FINAL CLINICAL IMPRESSION(S) / ED DIAGNOSES   Final diagnoses:  Migraine with status migrainosus, not intractable, unspecified migraine type     Rx / DC Orders   ED Discharge Orders          Ordered    prochlorperazine (COMPAZINE) 10 MG tablet  Every 6 hours PRN        09/04/21 1818             Note:  This document was prepared using Dragon voice recognition software and may include unintentional dictation errors.   Chesley Noon, MD 09/04/21 [phone]

## 2021-09-08 ENCOUNTER — Emergency Department
Admission: EM | Admit: 2021-09-08 | Discharge: 2021-09-09 | Disposition: A | Discharge status: Home / Self Care | Payer: Medicaid Other | Attending: Emergency Medicine | Admitting: Emergency Medicine

## 2021-09-08 ENCOUNTER — Emergency Department: Payer: Medicaid Other

## 2021-09-08 ENCOUNTER — Other Ambulatory Visit: Payer: Self-pay

## 2021-09-08 DIAGNOSIS — N390 Urinary tract infection, site not specified: Secondary | ICD-10-CM | POA: Diagnosis not present

## 2021-09-08 DIAGNOSIS — R748 Abnormal levels of other serum enzymes: Secondary | ICD-10-CM | POA: Insufficient documentation

## 2021-09-08 DIAGNOSIS — Z20822 Contact with and (suspected) exposure to covid-19: Secondary | ICD-10-CM | POA: Diagnosis not present

## 2021-09-08 DIAGNOSIS — I10 Essential (primary) hypertension: Secondary | ICD-10-CM | POA: Diagnosis not present

## 2021-09-08 DIAGNOSIS — R519 Headache, unspecified: Secondary | ICD-10-CM | POA: Diagnosis present

## 2021-09-08 LAB — CBC WITH DIFFERENTIAL/PLATELET
Abs Immature Granulocytes: 0.06 10*3/uL (ref 0.00–0.07)
Basophils Absolute: 0.1 10*3/uL (ref 0.0–0.1)
Basophils Relative: 1 %
Eosinophils Absolute: 0.1 10*3/uL (ref 0.0–0.5)
Eosinophils Relative: 1 %
HCT: 42.7 % (ref 36.0–46.0)
Hemoglobin: 14.1 g/dL (ref 12.0–15.0)
Immature Granulocytes: 1 %
Lymphocytes Relative: 38 %
Lymphs Abs: 2.5 10*3/uL (ref 0.7–4.0)
MCH: 28.3 pg (ref 26.0–34.0)
MCHC: 33 g/dL (ref 30.0–36.0)
MCV: 85.6 fL (ref 80.0–100.0)
Monocytes Absolute: 0.4 10*3/uL (ref 0.1–1.0)
Monocytes Relative: 6 %
Neutro Abs: 3.4 10*3/uL (ref 1.7–7.7)
Neutrophils Relative %: 53 %
Platelets: 183 10*3/uL (ref 150–400)
RBC: 4.99 MIL/uL (ref 3.87–5.11)
RDW: 13.7 % (ref 11.5–15.5)
Smear Review: NORMAL
WBC: 6.5 10*3/uL (ref 4.0–10.5)
nRBC: 0 % (ref 0.0–0.2)

## 2021-09-08 LAB — COMPREHENSIVE METABOLIC PANEL
ALT: 88 U/L — ABNORMAL HIGH (ref 0–44)
AST: 66 U/L — ABNORMAL HIGH (ref 15–41)
Albumin: 4.2 g/dL (ref 3.5–5.0)
Alkaline Phosphatase: 180 U/L — ABNORMAL HIGH (ref 38–126)
Anion gap: 10 (ref 5–15)
BUN: 7 mg/dL (ref 6–20)
CO2: 26 mmol/L (ref 22–32)
Calcium: 9.5 mg/dL (ref 8.9–10.3)
Chloride: 102 mmol/L (ref 98–111)
Creatinine, Ser: 0.9 mg/dL (ref 0.44–1.00)
GFR, Estimated: >60 mL/min (ref >60)
Glucose, Bld: 101 mg/dL — ABNORMAL HIGH (ref 70–99)
Potassium: 3.7 mmol/L (ref 3.5–5.1)
Sodium: 138 mmol/L (ref 135–145)
Total Bilirubin: 0.9 mg/dL (ref 0.3–1.2)
Total Protein: 8.4 g/dL — ABNORMAL HIGH (ref 6.5–8.1)

## 2021-09-08 LAB — POC URINE PREG, ED: Preg Test, Ur: NEGATIVE

## 2021-09-08 LAB — MONONUCLEOSIS SCREEN: Mono Screen: NEGATIVE

## 2021-09-08 MED ORDER — ACETAMINOPHEN 500 MG PO TABS
1000.0000 mg | ORAL_TABLET | Freq: Once | ORAL | Status: AC
  Administered 2021-09-08: 1000 mg via ORAL

## 2021-09-08 MED ORDER — PROCHLORPERAZINE MALEATE 10 MG PO TABS
10.0000 mg | ORAL_TABLET | Freq: Once | ORAL | Status: AC
  Administered 2021-09-08: 10 mg via ORAL
  Filled 2021-09-08: qty 1

## 2021-09-08 NOTE — ED Provider Triage Note (Signed)
Emergency Medicine Provider Triage Evaluation Note  Robin Roman , a 26 y.o. female  was evaluated in triage.  Pt complains of worsening frontal headache.  Patient presents to the ED with frontal headache times a week.  Was seen once, given migraine medications with no relief of symptoms.  Patient reports worsening and ongoing frontal headache..  Review of Systems  Positive: Frontal headache Negative: Unilateral weakness, slurred speech, vision changes  Physical Exam  BP (!) 147/80 (BP Location: Right Arm)   Pulse 85   Temp 98.4 F (36.9 C) (Oral)   Resp 18   Ht 5\' 8"  (1.727 m)   Wt 99.8 kg   LMP 09/04/2021   SpO2 99%   BMI 33.45 kg/m  Gen:   Awake, no distress   Resp:  Normal effort  MSK:   Moves extremities without difficulty  Other:  Neurologically intact  Medical Decision Making  Medically screening exam initiated at 8:38 PM.  Appropriate orders placed.  Robin Roman was informed that the remainder of the evaluation will be completed by another provider, this initial triage assessment does not replace that evaluation, and the importance of remaining in the ED until their evaluation is complete.  Patient presents to the ED with worsening frontal headache, states that headache has been ongoing times a week.  Was seen once, given headache medication with no relief.  Return for worsening symptoms.  We will perform labs, imaging at this time.   09/06/2021, PA-C 09/08/21 2039

## 2021-09-08 NOTE — ED Triage Notes (Signed)
Reports h/a x1 week. Seen last week for same. Received medication to break h/a and was discharged per pt report. Pt reports woke up the next AM with h/a returned. Also reports worsening body aches and subjective fever with chills and sweat. Reports chills and sweat worse at night while asleep. Denies cough or congestion. Denies follow up with neurologist since discharge. Pt alert and oriented breathing unlabored and speaking in full sentences. Denies numbness or tingling anywhere at this time but does endorse intermittent n/t over las week, particularly after standing.

## 2021-09-09 LAB — URINALYSIS, ROUTINE W REFLEX MICROSCOPIC
Bilirubin Urine: NEGATIVE
Glucose, UA: NEGATIVE mg/dL
Ketones, ur: 5 mg/dL — AB
Nitrite: POSITIVE — AB
Protein, ur: 100 mg/dL — AB
Specific Gravity, Urine: 1.031 — ABNORMAL HIGH (ref 1.005–1.030)
WBC, UA: >50 WBC/hpf — ABNORMAL HIGH (ref 0–5)
pH: 5 (ref 5.0–8.0)

## 2021-09-09 LAB — RESP PANEL BY RT-PCR (FLU A&B, COVID) ARPGX2
Influenza A by PCR: NEGATIVE
Influenza B by PCR: NEGATIVE
SARS Coronavirus 2 by RT PCR: NEGATIVE

## 2021-09-09 MED ORDER — CEPHALEXIN 500 MG PO CAPS
1000.0000 mg | ORAL_CAPSULE | Freq: Once | ORAL | Status: AC
  Administered 2021-09-09: 1000 mg via ORAL
  Filled 2021-09-09: qty 2

## 2021-09-09 MED ORDER — CEPHALEXIN 500 MG PO CAPS: 1000.0000 mg | ORAL_CAPSULE | Freq: Two times a day (BID) | ORAL | 0 refills | Status: AC

## 2021-09-09 NOTE — ED Provider Notes (Signed)
[facility] Provider Note    Event Date/Time   First MD Initiated Contact with Patient 09/08/21 2238     (approximate)   History   Chief Complaint Headache and Generalized Body Aches   HPI Robin Roman is a 26 y.o. female, history of migraines, hypertension, presents to the emergency department for evaluation of headache and generalized body aches x1 week.  She states that she was seen last week for headache, which was treated in the emergency department successfully, however the headache has returned and she is now experiencing worsening body aches and subjective fever/night sweats.  Denies cough, congestion, chest pain, shortness of breath, neck pain/stiffness, abdominal pain, nausea/vomiting, diarrhea, vision changes, hearing changes, dizziness/lightheadedness, rashes/lesions, or urinary symptoms.   History Limitations: No limitations.        Physical Exam  Triage Vital Signs: ED Triage Vitals  Enc Vitals Group     BP 09/08/21 2034 (!) 147/80     Pulse Rate 09/08/21 2034 85     Resp 09/08/21 2034 18     Temp 09/08/21 2038 98.4 F (36.9 C)     Temp Source 09/08/21 2034 Oral     SpO2 09/08/21 2034 99 %     Weight 09/08/21 2035 220 lb (99.8 kg)     Height 09/08/21 2035 5\' 8"  (1.727 m)     Head Circumference --      Peak Flow --      Pain Score 09/08/21 2034 9     Pain Loc --      Pain Edu? --      Excl. in GC? --     Most recent vital signs: Vitals:   09/08/21 2034 09/08/21 2038  BP: (!) 147/80   Pulse: 85   Resp: 18   Temp:  98.4 F (36.9 C)  SpO2: 99%     General: Awake, NAD.  Skin: Warm, dry. No rashes or lesions.  Eyes: PERRL. Conjunctivae normal.  CV: Good peripheral perfusion.  Resp: Normal effort.  Abd: Soft, non-tender. No distention.  Neuro: At baseline. No gross neurological deficits.   Focused Exam: N/A  Physical Exam    ED Results / Procedures / Treatments  Labs (all labs ordered are listed, but only  abnormal results are displayed) Labs Reviewed  COMPREHENSIVE METABOLIC PANEL - Abnormal; Notable for the following components:      Result Value   Glucose, Bld 101 (*)    Total Protein 8.4 (*)    AST 66 (*)    ALT 88 (*)    Alkaline Phosphatase 180 (*)    All other components within normal limits  URINALYSIS, ROUTINE W REFLEX MICROSCOPIC - Abnormal; Notable for the following components:   Color, Urine AMBER (*)    APPearance CLOUDY (*)    Specific Gravity, Urine 1.031 (*)    Hgb urine dipstick SMALL (*)    Ketones, ur 5 (*)    Protein, ur 100 (*)    Nitrite POSITIVE (*)    Leukocytes,Ua MODERATE (*)    WBC, UA >50 (*)    Bacteria, UA RARE (*)    All other components within normal limits  RESP PANEL BY RT-PCR (FLU A&B, COVID) [id number]  URINE CULTURE  CBC WITH DIFFERENTIAL/PLATELET  MONONUCLEOSIS SCREEN  POC URINE PREG, ED     EKG N/A.   RADIOLOGY  ED Provider Interpretation: I personally reviewed and interpreted the CT scan, no evidence of acute intracranial pathology.  CT Head Wo Contrast  Result Date: 09/08/2021 CLINICAL DATA:  Worsening frontal headache. EXAM: CT HEAD WITHOUT CONTRAST TECHNIQUE: Contiguous axial images were obtained from the base of the skull through the vertex without intravenous contrast. RADIATION DOSE REDUCTION: This exam was performed according to the departmental dose-optimization program which includes automated exposure control, adjustment of the mA and/or kV according to patient size and/or use of iterative reconstruction technique. COMPARISON:  September 04, 2021 FINDINGS: Brain: No evidence of acute infarction, hemorrhage, hydrocephalus, extra-axial collection or mass lesion/mass effect. Vascular: No hyperdense vessel or unexpected calcification. Skull: Normal. Negative for fracture or focal lesion. Sinuses/Orbits: No acute finding. Other: None. IMPRESSION: No acute intracranial pathology. Electronically Signed   By: Aram Candela M.D.   On:  09/08/2021 20:57    PROCEDURES:  Critical Care performed: N/A  Procedures    MEDICATIONS ORDERED IN ED: Medications  cephALEXin (KEFLEX) capsule 1,000 mg (has no administration in time range)  acetaminophen (TYLENOL) tablet 1,000 mg (1,000 mg Oral Given 09/08/21 2349)  prochlorperazine (COMPAZINE) tablet 10 mg (10 mg Oral Given 09/08/21 2350)     IMPRESSION / MDM / ASSESSMENT AND PLAN / ED COURSE  I reviewed the triage vital signs and the nursing notes.                              Differential diagnosis includes, but is not limited to, COVID-19, influenza, urinary tract infection, mononucleosis,   ED Course Patient appears well, vitals are within normal limits.     CBC shows no leukocytosis or anemia.  CMP shows no significant electrolyte abnormalities, though does show slightly elevated liver enzymes.   Urinalysis notable for positive nitrates, moderate leukocytes, and bacteria.  Highly suggestive of urinary tract infection.   Assessment/Plan Patient presents with ongoing headache and myalgias x1 week.  Physical exam is unremarkable.  Patient appears clinically well.  However, her urinalysis is highly suggestive of urinary tract infection.  I suspect this is likely the etiology of her symptoms.  She does have mild transaminitis present on her labs as well, though does not endorse any right upper quadrant pain or history suggestive of liver/gallbladder pathology.  We will go ahead and treat the urinary tract infection with cephalexin here as well as provide an outpatient prescription for it.  Advised her to follow-up with her primary care provider as needed.  We will plan to discharge   Patient's presentation is most consistent with acute illness / injury with system symptoms.    Considered admission for this patient, but given her stable presentation, she is unlikely to benefit from admission.   Provided the patient with anticipatory guidance, return precautions, and  educational material. Encouraged the patient to return to the emergency department at any time if they begin to experience any new or worsening symptoms. Patient expressed understanding and agreed with the plan.         FINAL CLINICAL IMPRESSION(S) / ED DIAGNOSES   Final diagnoses:  Urinary tract infection without hematuria, site unspecified     Rx / DC Orders   ED Discharge Orders          Ordered    cephALEXin (KEFLEX) 500 MG capsule  2 times daily        09/09/21 0021             Note:  This document was prepared using Dragon voice recognition software and may include unintentional dictation errors.   Varney Daily, PA  09/09/21 0120    Minna Antis, MD 09/10/21 1931

## 2021-09-09 NOTE — ED Notes (Signed)
Patient discharged at this time. Ambulated to lobby with independent and steady gait. Breathing unlabored speaking in full sentences. Verbalized understanding of all discharge, follow up, and medication teaching. Discharged homed with all belongings.   

## 2021-09-09 NOTE — Discharge Instructions (Addendum)
-  Take all of your antibiotics as prescribed.  -You may continue to treat body aches with Tylenol/ibuprofen as needed.  -Follow-up with your primary care provider as needed.  -Return to the emergency department anytime if you begin to experience any new or worsening symptoms.

## 2021-09-11 LAB — URINE CULTURE: Culture: >=100000 — AB

## 2021-11-15 ENCOUNTER — Emergency Department
Admission: EM | Admit: 2021-11-15 | Discharge: 2021-11-15 | Discharge status: Against Medical Advice | Payer: Medicaid Other | Attending: Emergency Medicine | Admitting: Emergency Medicine

## 2021-11-15 ENCOUNTER — Other Ambulatory Visit: Payer: Self-pay

## 2021-11-15 DIAGNOSIS — L02414 Cutaneous abscess of left upper limb: Secondary | ICD-10-CM | POA: Diagnosis present

## 2021-11-15 DIAGNOSIS — Z5321 Procedure and treatment not carried out due to patient leaving prior to being seen by health care provider: Secondary | ICD-10-CM | POA: Diagnosis not present

## 2021-11-15 NOTE — ED Triage Notes (Signed)
Pt comes with c/o abscess under left arm. Pt states she just noticed it yesterday.

## 2021-11-15 NOTE — ED Notes (Signed)
Pt was moved on board but is not yet in room.

## 2022-09-17 ENCOUNTER — Other Ambulatory Visit: Payer: Self-pay

## 2022-09-17 ENCOUNTER — Emergency Department
Admission: EM | Admit: 2022-09-17 | Discharge: 2022-09-17 | Disposition: A | Discharge status: Home / Self Care | Payer: Medicaid Other | Attending: Emergency Medicine | Admitting: Emergency Medicine

## 2022-09-17 DIAGNOSIS — U071 COVID-19: Secondary | ICD-10-CM | POA: Diagnosis not present

## 2022-09-17 DIAGNOSIS — R519 Headache, unspecified: Secondary | ICD-10-CM | POA: Diagnosis present

## 2022-09-17 LAB — RESP PANEL BY RT-PCR (RSV, FLU A&B, COVID)  RVPGX2
Influenza A by PCR: NEGATIVE
Influenza B by PCR: NEGATIVE
Resp Syncytial Virus by PCR: NEGATIVE
SARS Coronavirus 2 by RT PCR: POSITIVE — AB

## 2022-09-17 MED ORDER — BENZONATATE 100 MG PO CAPS: ORAL_CAPSULE | ORAL | 0 refills | Status: AC

## 2022-09-17 MED ORDER — ONDANSETRON 4 MG PO TBDP: 4.0000 mg | ORAL_TABLET | Freq: Three times a day (TID) | ORAL | 0 refills | Status: AC | PRN

## 2022-09-17 MED ORDER — PSEUDOEPH-BROMPHEN-DM 30-2-10 MG/5ML PO SYRP: 5.0000 mL | ORAL_SOLUTION | Freq: Four times a day (QID) | ORAL | 1 refills | Status: AC | PRN

## 2022-09-17 MED ORDER — IBUPROFEN 800 MG PO TABS
800.0000 mg | ORAL_TABLET | Freq: Once | ORAL | Status: AC
  Administered 2022-09-17: 800 mg via ORAL
  Filled 2022-09-17: qty 1

## 2022-09-17 MED ORDER — PREDNISONE 20 MG PO TABS: 40.0000 mg | ORAL_TABLET | Freq: Every day | ORAL | 0 refills | Status: AC

## 2022-09-17 NOTE — ED Provider Notes (Signed)
[facility] Emergency Department Provider Note     Event Date/Time   First MD Initiated Contact with Patient 09/17/22 1907     (approximate)   History   Headache, Generalized Body Aches, and Nasal Congestion   HPI  Robin Roman is a 27 y.o. female with a noncontributory medical history, presents to the ED for evaluation of generalized bodyaches with congestion and sinus pressure patient's rib reports a mild intermittent headache with the symptom onset yesterday.  She denies any frank fevers, chills, chest pain, or shortness of breath patient also denies any known sick contacts.  Physical Exam   Triage Vital Signs: ED Triage Vitals  Encounter Vitals Group     BP 09/17/22 1812 (!) 144/87     Systolic BP Percentile --      Diastolic BP Percentile --      Pulse Rate 09/17/22 1812 88     Resp 09/17/22 1812 16     Temp 09/17/22 1812 99.1 F (37.3 C)     Temp Source 09/17/22 1812 Oral     SpO2 09/17/22 1812 98 %     Weight 09/17/22 1813 205 lb (93 kg)     Height 09/17/22 1813 5\' 7"  (1.702 m)     Head Circumference --      Peak Flow --      Pain Score 09/17/22 1822 6     Pain Loc --      Pain Education --      Exclude from Growth Chart --     Most recent vital signs: Vitals:   09/17/22 1812  BP: (!) 144/87  Pulse: 88  Resp: 16  Temp: 99.1 F (37.3 C)  SpO2: 98%    General Awake, no distress.  HEENT NCAT. PERRL. EOMI. No rhinorrhea. Mucous membranes are moist.  CV:  Good peripheral perfusion. RRR RESP:  Normal effort. CTA ABD:  No distention.    ED Results / Procedures / Treatments   Labs (all labs ordered are listed, but only abnormal results are displayed) Labs Reviewed  RESP PANEL BY RT-PCR (RSV, FLU A&B, COVID)  RVPGX2 - Abnormal; Notable for the following components:      Result Value   SARS Coronavirus 2 by RT PCR POSITIVE (*)    All other components within normal limits     EKG   RADIOLOGY  No results  found.   PROCEDURES:  Critical Care performed: No  Procedures   MEDICATIONS ORDERED IN ED: Medications - No data to display   IMPRESSION / MDM / ASSESSMENT AND PLAN / ED COURSE  I reviewed the triage vital signs and the nursing notes.                              Differential diagnosis includes, but is not limited to, COVID, flu, RSV, viral URI, strep pharyngitis, AOM  Patient's presentation is most consistent with acute complicated illness / injury requiring diagnostic workup.  Patient's diagnosis is consistent with COVID infection.  Patient's viral PCR confirms diagnosis.  Patient's clinical picture is reassuring that she presents in no acute respiratory distress, without signs of dehydration or toxic appearance.  Patient will be discharged home with prescriptions for Tessalon Perles, prednisone, Zofran, and Bromfed DM syrup. Patient is to follow up with [facility] department as needed or otherwise directed. Patient is given ED precautions to return to the ED for any worsening or  new symptoms.     FINAL CLINICAL IMPRESSION(S) / ED DIAGNOSES   Final diagnoses:  COVID     Rx / DC Orders   ED Discharge Orders          Ordered    benzonatate (TESSALON PERLES) 100 MG capsule        09/17/22 1920    ondansetron (ZOFRAN-ODT) 4 MG disintegrating tablet  Every 8 hours PRN        09/17/22 1920    predniSONE (DELTASONE) 20 MG tablet  Daily with breakfast        09/17/22 1920    brompheniramine-pseudoephedrine-DM 30-2-10 MG/5ML syrup  4 times daily PRN        09/17/22 1920             Note:  This document was prepared using Dragon voice recognition software and may include unintentional dictation errors.    Lissa Hoard, PA-C 09/17/22 1930    Jene Every, MD 09/17/22 2040

## 2022-09-17 NOTE — Discharge Instructions (Signed)
Your covid test is positive. Take the prescription meds as directed.  Treat fevers with OTC Tylenol.  Follow-up with primary provider or [facility] department for ongoing concerns.  Return to ED if needed.

## 2022-09-17 NOTE — ED Triage Notes (Signed)
Pt arrives via POV w/ c/o generalized body aches, congestion/sinus pressure, HA that started yesterday.

## 2022-09-17 NOTE — ED Notes (Signed)
See triage note  Presents with body aches and headache since yesterday  Low grade temp on arrival

## 2023-03-08 ENCOUNTER — Other Ambulatory Visit: Payer: Self-pay

## 2023-03-08 ENCOUNTER — Emergency Department
Admission: EM | Admit: 2023-03-08 | Discharge: 2023-03-08 | Disposition: A | Discharge status: Home / Self Care | Payer: Medicaid Other | Attending: Emergency Medicine | Admitting: Emergency Medicine

## 2023-03-08 DIAGNOSIS — L0291 Cutaneous abscess, unspecified: Secondary | ICD-10-CM

## 2023-03-08 DIAGNOSIS — L02213 Cutaneous abscess of chest wall: Secondary | ICD-10-CM | POA: Diagnosis present

## 2023-03-08 MED ORDER — CLINDAMYCIN HCL 150 MG PO CAPS
300.0000 mg | ORAL_CAPSULE | Freq: Once | ORAL | Status: AC
  Administered 2023-03-08: 300 mg via ORAL
  Filled 2023-03-08: qty 2

## 2023-03-08 MED ORDER — HYDROCODONE-ACETAMINOPHEN 5-325 MG PO TABS: 1.0000 | ORAL_TABLET | ORAL | 0 refills | Status: AC | PRN

## 2023-03-08 MED ORDER — LIDOCAINE HCL (PF) 1 % IJ SOLN: 5.0000 mL | Freq: Once | INTRAMUSCULAR | Status: AC

## 2023-03-08 MED ORDER — LIDOCAINE HCL (PF) 1 % IJ SOLN
INTRAMUSCULAR | Status: AC
  Administered 2023-03-08: 5 mL
  Filled 2023-03-08: qty 5

## 2023-03-08 MED ORDER — CLINDAMYCIN HCL 300 MG PO CAPS: 300.0000 mg | ORAL_CAPSULE | Freq: Three times a day (TID) | ORAL | 0 refills | Status: AC

## 2023-03-08 NOTE — ED Provider Notes (Signed)
[facility] Provider Note    Event Date/Time   First MD Initiated Contact with Patient 03/08/23 [phone]     (approximate)  History   Chief Complaint: Knot on Breast  HPI  Robin Roman is a 28 y.o. female with a past medical history of abscesses previously who comes to the emergency department for a tender area in between her breast.  According to the patient for the last few days she has noticed a tender and somewhat swollen area in between her breasts which is in the same area where she has had to have an abscess drained previously.  No fever.  Physical Exam   Triage Vital Signs: ED Triage Vitals [03/08/23 0903]  Encounter Vitals Group     BP (!) 157/93     Systolic BP Percentile      Diastolic BP Percentile      Pulse Rate 79     Resp 18     Temp 98.8 F (37.1 C)     Temp Source Oral     SpO2 99 %     Weight 210 lb (95.3 kg)     Height 5\' 7"  (1.702 m)     Head Circumference      Peak Flow      Pain Score 6     Pain Loc      Pain Education      Exclude from Growth Chart     Most recent vital signs: Vitals:   03/08/23 0903  BP: (!) 157/93  Pulse: 79  Resp: 18  Temp: 98.8 F (37.1 C)  SpO2: 99%    General: Awake, no distress.  CV:  Good peripheral perfusion.   Resp:  Normal effort.   Other:  Patient has a tender area in between the breasts, exam done with female chaperone in the room.  Bedside ultrasound shows an approximate 1 x 1.5 cm fluid collection.   ED Results / Procedures / Treatments   MEDICATIONS ORDERED IN ED: Medications  lidocaine (PF) (XYLOCAINE) 1 % injection 5 mL (has no administration in time range)  lidocaine (PF) (XYLOCAINE) 1 % injection (has no administration in time range)     IMPRESSION / MDM / ASSESSMENT AND PLAN / ED COURSE  I reviewed the triage vital signs and the nursing notes.  Patient's presentation is most consistent with acute illness / injury with system symptoms.  Patient presents  emergency department for tender swollen area between the breast where she has had a prior abscess in the past.  Bedside ultrasound shows approximately 1 x 1.5 cm area of fluid collection.  I was able to make a small incision with an 11 blade and drained approximately 2 cc of exudate.  We will cover with antibiotics as a precaution.  Discussed return precautions.  Patient agreeable to plan of care.  INCISION AND DRAINAGE Performed by: Minna Antis Consent: Verbal consent obtained. Risks and benefits: risks, benefits and alternatives were discussed Type: abscess  Body area: Chest   Anesthesia: local infiltration  Incision was made with a scalpel.  Local anesthetic: lidocaine 1% without epinephrine  Anesthetic total: 3 ml  Complexity: complex Blunt dissection to break up loculations  Drainage: purulent  Drainage amount: 2 to 3 cc  Patient tolerance: Patient tolerated the procedure well with no immediate complications.    FINAL CLINICAL IMPRESSION(S) / ED DIAGNOSES   Abscess  Rx / DC Orders   Clindamycin  Note:  This document was prepared using  Dragon Chemical engineer and may include unintentional dictation errors.   Minna Antis, MD 03/08/23 1029

## 2023-03-08 NOTE — ED Triage Notes (Signed)
Pt states "knot to R breast". Pt states HX of abscess but states this is different. Pt denies fevers or chills. Pt states painful sometimes. NAD noted.

## 2023-08-12 ENCOUNTER — Ambulatory Visit: Admitting: Nurse Practitioner

## 2023-08-12 ENCOUNTER — Encounter: Payer: Self-pay | Admitting: Nurse Practitioner

## 2023-08-12 VITALS — BP 141/85 | HR 103 | Ht 67.5 in | Wt 232.4 lb

## 2023-08-12 DIAGNOSIS — Z01419 Encounter for gynecological examination (general) (routine) without abnormal findings: Secondary | ICD-10-CM

## 2023-08-12 DIAGNOSIS — Z113 Encounter for screening for infections with a predominantly sexual mode of transmission: Secondary | ICD-10-CM

## 2023-08-12 DIAGNOSIS — Z3009 Encounter for other general counseling and advice on contraception: Secondary | ICD-10-CM

## 2023-08-12 DIAGNOSIS — Z309 Encounter for contraceptive management, unspecified: Secondary | ICD-10-CM | POA: Diagnosis not present

## 2023-08-12 LAB — WET PREP FOR TRICH, YEAST, CLUE
Clue Cell Exam: NEGATIVE
Trichomonas Exam: NEGATIVE
Yeast Exam: NEGATIVE

## 2023-08-12 LAB — HM HIV SCREENING LAB: HM HIV Screening: NEGATIVE

## 2023-08-12 LAB — HM HEPATITIS C SCREENING LAB: HM Hepatitis Screen: NEGATIVE

## 2023-08-13 ENCOUNTER — Ambulatory Visit: Payer: Self-pay | Admitting: Nurse Practitioner

## 2023-08-13 NOTE — Progress Notes (Signed)
 Reviewed in office date of collection.

## 2023-08-14 LAB — HBV ANTIGEN/ANTIBODY STATE LAB
Hep B Core Total Ab: NONREACTIVE
Hep B S Ab: REACTIVE
Hepatitis B Surface Antigen: NONREACTIVE

## 2023-08-25 ENCOUNTER — Encounter: Payer: Self-pay | Admitting: Nurse Practitioner

## 2023-08-25 NOTE — Progress Notes (Signed)
 [employer] [facility] HE[facility] DEPARTMENT [facility] [address] [place] [address] Main phone: [phone]  Family Planning Visit - Repeat Yearly Visit  Subjective:  Robin Roman is a 28 y.o. [id number]  being seen today for an annual wellness visit and to discuss contraception options. The patient is currently using no method - no contraceptive precautions for pregnancy prevention. Patient does not want a pregnancy in the next year.   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 female who presents to the office today for annual well woman exam, CBE, and asymptomatic STI testing  Patient reports concerns today include the following: Bumps around breasts and blurry vision.  Patient reports last sex was 2 weeks ago. She indicates use of female condoms as contraception method.  Patient indicates LMP was 07/27/23 and has periods monthly.    Review of Systems  Constitutional:  Negative for weight loss.  HENT:  Negative for sore throat.   Eyes:  Positive for blurred vision.  Respiratory:  Negative for cough, shortness of breath and wheezing.   Cardiovascular:  Negative for chest pain and claudication.  Gastrointestinal:  Negative for nausea and vomiting.  Genitourinary:  Negative for dysuria and frequency.  Skin:  Negative for rash.       Bumps under breasts.  Neurological:  Negative for dizziness, seizures and headaches.  Endo/Heme/Allergies:  Does not bruise/bleed easily.    See flowsheet for further details and programmatic requirements Hyperlink available at the top of the signed note in blue.  Flow sheet content below:  Pregnancy Intention Screening Does the patient want to become pregnant in the next  year?: No Does the patient's partner want to become pregnant in the next year?: No Would the patient like to discuss contraceptive options today?: No Results Follow up Password: [id number] Contraception History Past methods of contraception used by patient:: Hormonal Injection, Contraceptive Pill Adverse effects associated with Contraceptive Pill: none Adverse effects associated with Hormonal Injection: none Sexual History What age did you start your period?: 13 How often do you have your period?: monthly Date of last sex?: 07/29/23 Has the patient had unprotected sex within the last 5 days?: No Do you have sex with men, women, both men and women?: Men only In the past 2 months how many partners have you had sex with?: 1 In the past 12 months, how many partners have you had sex with?: 1 Is it possible that any of your sex partners in the past 12 months had sex with someone else whild they were still in a sexual relationship with you?: Yes What ways do you have sex?: Vaginal Do you or your partner use condoms and/or dental dams every time you have vaginal, oral or anal sex?: Sometimes Do you douche?: No Date of last HIV test?: 07/09/21 Have you ever had an STD?: Yes Have any of your partners had an STD?: Yes Partner Previous STD?:  (pt cannot recall) Date?:  (years ago) Have you or your partner ever shot up drugs?: No Have any of your partners used drugs in the past?: No Have you or your partners exchanged money or drugs for sex?: No Risk Factors for Hep B Household,  sexual, or needle sharing contact of a person infected with Hep B: No Sexual contact with a person who uses drugs not as prescribed?: No Currently or Ever used drugs not as prescribed: No HIV Positive: No Men who have sex with men: No Have Hepatitis C: No History of Incarceration: No History of Homeslessness?: No Anal sex following anal drug use?: No Risk Factors for Hep C Currently using drugs not as prescribed:  No Sexual partner(s) currently using drugs as not prescribed: No History of drug use: Yes HIV Positive: No People with a history of incarceration: No People born between the years of 52 and 42: No Hepatitis Counseling Hep C Counseling: Counseled patient about increased risk of Hep C and recommendation for testing, Patient accepts testing for Hep C today Counseling All Patients: Use specific methods of contraceoptive and identify adverse effects (R), Stop tobacco use, implementing the 5A counseling approach (R), Encourage mammagram for women 22 or older and younger than 50 if conditions support (R), Emergency Contraception Offered (R) if unprotected sex in past 5 days and/or propyhlactically as indicated., Provide emergency contraception counseling (R), Typical use rates for method effectiveness (R), Delay future pregnancy from 18 months to 5 years (R) at [facility] visit, Appropriate referral for additional services as needed (R) Education: Make informed decision about family planning, Reduce risk of transmission and protection from STD's and HIV, Understand BMI >25 or >18.5 is a health risk (weight management educational materials to be provided to client requests), Promoted daily consumption of MVI with folic acid if capable of conceiving., Results of physical assessment and labs (if performed), How to discontinue the method selected and information on back up method used, How to use the method selected and information on back up method used, How to use the method consistently and correctly, Teach back method completed, Warning signs for rare but serious adverse events and what to do if they experience a warning sign (including emergency 24 hour number, where to seek emergency service outside of hours of operation), When to return for follow up (planned return schedule), Is patient pregnant?, PCP list given to patient Contraception Wrap Up Current Method: No Method - Other Reason Reason for No Current  Contraceptive Method at Intake ([facility] Only): Other End Method: No Method - Other Reason Contraception Counseling Provided: Yes How was the end contraceptive method provided?: N/A  Diabetes screening This patient is 28 y.o. with a BMI of Body mass index is 35.86 kg/m.SABRA  Is patient eligible for diabetes screening (age >35 and BMI >25)?  no  Was Hgb A1c ordered? not applicable  STI screening Patient reports 1 of partners in last year.  Does this patient desire STI screening?  Yes  Hepatitis C screening Has patient been screened once for HCV in the past?  No  No results found for: HCVAB  Does the patient meet criteria for HCV testing? Yes  (If yes-- Screen for HCV through [facility]) Criteria:  Since the last HCV result, does the patient have any of the following? - Current drug use - Have a partner with drug use - Has been incarcerated  Hepatitis B screening Does the patient meet criteria for HBV testing? Yes Criteria:  -Household, sexual or needle sharing contact with HBV -History of drug use -HIV positive -Those with known Hep C  Cervical Cancer Screening  Result Date Procedure Results Follow-ups  07/09/2021 IGP, rfx Aptima HPV ASCU DIAGNOSIS:: Comment Specimen adequacy:: Comment Clinician Provided ICD10: Comment Performed by:: Comment PAP Smear  Comment: . Note:: Comment Test Methodology: Comment PAP Reflex: Comment     Health Maintenance Due  Topic Date Due   HPV VACCINES (2 - 2-dose series) 06/15/2012   COVID-19 Vaccine (1 - 2024-25 season) Never done    The following portions of the patient's history were reviewed and updated as appropriate: allergies, current medications, past family history, past medical history, past social history, past surgical history and problem list. Problem list updated.  Objective:   Vitals:   08/12/23 1339  BP: (!) 141/85  Pulse: (!) 103  Weight: 232 lb 6.4 oz (105.4 kg)  Height: 5' 7.5 (1.715 m)    Physical  Exam Vitals and nursing note reviewed. Exam conducted with a chaperone present.  Constitutional:      Appearance: Normal appearance.  HENT:     Head: Normocephalic.     Salivary Glands: Right salivary gland is not diffusely enlarged or tender. Left salivary gland is not diffusely enlarged or tender.     Mouth/Throat:     Lips: Pink. No lesions.     Mouth: Mucous membranes are moist.     Tongue: No lesions. Tongue does not deviate from midline.     Pharynx: Oropharynx is clear. Uvula midline.     Tonsils: No tonsillar exudate.  Eyes:     General:        Right eye: No discharge.        Left eye: No discharge.     Conjunctiva/sclera:     Right eye: Right conjunctiva is not injected.     Left eye: Left conjunctiva is not injected.  Neck:     Thyroid: No thyroid mass, thyromegaly or thyroid tenderness.     Trachea: Trachea and phonation normal. No tracheal tenderness or tracheal deviation.  Cardiovascular:     Rate and Rhythm: Normal rate and regular rhythm.     Heart sounds: Normal heart sounds, S1 normal and S2 normal.  Pulmonary:     Effort: Pulmonary effort is normal.     Breath sounds: Normal breath sounds and air entry.  Chest:  Breasts:    Tanner Score is 5.     Breasts are symmetrical.     Right: Normal.     Left: Normal.     Comments: CBE completed.  Patient with bumps around breasts bilaterally consistent with Hydradenitis Suppurativa.  Abdominal:     General: Abdomen is flat. Bowel sounds are normal. There is no distension.     Palpations: Abdomen is soft.     Tenderness: There is no abdominal tenderness. There is no guarding or rebound.  Lymphadenopathy:     Head:     Right side of head: No submental, submandibular, tonsillar, preauricular or posterior auricular adenopathy.     Left side of head: No submental, submandibular, tonsillar, preauricular or posterior auricular adenopathy.     Cervical: No cervical adenopathy.     Right cervical: No superficial or  posterior cervical adenopathy.    Left cervical: No superficial or posterior cervical adenopathy.     Upper Body:     Right upper body: No supraclavicular or axillary adenopathy.     Left upper body: No supraclavicular or axillary adenopathy.  Skin:    General: Skin is warm and dry.     Findings: No rash.     Comments: Skin tone appropriate for ethnicity. Assessed exposed areas and back only.    Neurological:     Mental Status: She is alert and oriented to person,  place, and time.  Psychiatric:        Attention and Perception: Attention and perception normal.        Mood and Affect: Mood and affect normal.        Speech: Speech normal.        Behavior: Behavior normal. Behavior is cooperative.        Thought Content: Thought content normal.     Assessment and Plan:  Robin Roman is a 28 y.o. female [phone] presenting to the [facility] Department for an yearly wellness and contraception visit  1. Family planning (Primary) Contraception counseling:  Reviewed options based on patient desire and reproductive life plan. Patient is interested in No Method - No Contraceptive Precautions.   Risks, benefits, and typical effectiveness rates were reviewed.  Questions were answered.  Written information was also given to the patient to review.    The patient will follow up in  1 years for surveillance.  The patient was told to call with any further questions, or with any concerns about this method of contraception.  Emphasized use of condoms 100% of the time for STI prevention.  Emergency Contraception Precautions (ECP): Patient assessed for need of ECP. She is not a candidate based on barrier method used 100% correctly during intercourse per patient's confident report.    2. Well woman exam CBE completed today.  PAP due in 1 year.  Encouraged patient to establish care with PCP and have them refer her for eye exam.  Explained to patient bumps she is experiencing is  consistent with HS, which a PCP could prescribe medication for.   3. Screening for venereal disease  - Chlamydia/Gonorrhea [facility] - HBV Antigen/Antibody [facility] - HIV/HCV [facility] - Syphilis Serology, [facility] - WET PREP FOR TRICH, YEAST, CLUE   Return in about 1 year (around 08/11/2024) for annual well-woman exam.  No future appointments.  Clarita CROME Narrow, NP

## 2023-09-27 ENCOUNTER — Emergency Department
Admission: EM | Admit: 2023-09-27 | Discharge: 2023-09-27 | Disposition: A | Discharge status: Home / Self Care | Attending: Emergency Medicine | Admitting: Emergency Medicine

## 2023-09-27 ENCOUNTER — Other Ambulatory Visit: Payer: Self-pay

## 2023-09-27 ENCOUNTER — Emergency Department

## 2023-09-27 DIAGNOSIS — Y9301 Activity, walking, marching and hiking: Secondary | ICD-10-CM | POA: Insufficient documentation

## 2023-09-27 DIAGNOSIS — S8391XA Sprain of unspecified site of right knee, initial encounter: Secondary | ICD-10-CM | POA: Insufficient documentation

## 2023-09-27 DIAGNOSIS — W01198A Fall on same level from slipping, tripping and stumbling with subsequent striking against other object, initial encounter: Secondary | ICD-10-CM | POA: Insufficient documentation

## 2023-09-27 DIAGNOSIS — Y92000 Kitchen of unspecified non-institutional (private) residence as  the place of occurrence of the external cause: Secondary | ICD-10-CM | POA: Insufficient documentation

## 2023-09-27 DIAGNOSIS — M25561 Pain in right knee: Secondary | ICD-10-CM | POA: Diagnosis present

## 2023-09-27 MED ORDER — ACETAMINOPHEN 500 MG PO TABS
1000.0000 mg | ORAL_TABLET | Freq: Once | ORAL | Status: AC
  Administered 2023-09-27 (×2): 1000 mg via ORAL
  Filled 2023-09-27: qty 2

## 2023-09-27 NOTE — ED Provider Notes (Signed)
 [facility] Provider Note    Event Date/Time   First MD Initiated Contact with Patient 09/27/23 [phone]     (approximate)   History   Knee Pain   HPI  Naydeen L Ludington is a 28 y.o. female no significant past medical history presents to the emergency department with knee pain.  Patient states that she was walking in 6 inch heels and had a fall on Friday night in the kitchen to her right knee.  Twisted her right knee.  Ambulatory since that time but has ongoing knee swelling.  Denies head injury or loss of consciousness.  Ongoing pain and swelling to her right knee since that time.  No weakness.  Able to bear weight.  Not on anticoagulation.  Uncertain of her last tetanus shot.     Physical Exam   Triage Vital Signs: ED Triage Vitals  Encounter Vitals Group     BP 09/27/23 0932 (!) 141/91     Girls Systolic BP Percentile --      Girls Diastolic BP Percentile --      Boys Systolic BP Percentile --      Boys Diastolic BP Percentile --      Pulse Rate 09/27/23 0932 83     Resp 09/27/23 0932 18     Temp 09/27/23 0932 98.3 F (36.8 C)     Temp src --      SpO2 09/27/23 0932 99 %     Weight 09/27/23 0933 238 lb (108 kg)     Height 09/27/23 0933 5' 7 (1.702 m)     Head Circumference --      Peak Flow --      Pain Score 09/27/23 0932 6     Pain Loc --      Pain Education --      Exclude from Growth Chart --     Most recent vital signs: Vitals:   09/27/23 0932  BP: (!) 141/91  Pulse: 83  Resp: 18  Temp: 98.3 F (36.8 C)  SpO2: 99%    Physical Exam Constitutional:      Appearance: She is well-developed.  HENT:     Head: Atraumatic.  Eyes:     Conjunctiva/sclera: Conjunctivae normal.  Cardiovascular:     Rate and Rhythm: Regular rhythm.  Pulmonary:     Effort: No respiratory distress.  Abdominal:     General: There is no distension.  Musculoskeletal:        General: Normal range of motion.     Cervical back: Normal range of motion.      Comments: Tenderness palpation to the right knee.  Able to fully flex and extend and hold up the right leg to gravity.  +2 DP pulses.  Skin:    General: Skin is warm.  Neurological:     Mental Status: She is alert. Mental status is at baseline.      IMPRESSION / MDM / ASSESSMENT AND PLAN / ED COURSE  I reviewed the triage vital signs and the nursing notes.  Differential diagnosis including fracture, dislocation, meniscus injury, ligamentous injury, knee sprain   RADIOLOGY I independently reviewed imaging, my interpretation of imaging: X-ray of the right knee with no acute fracture or dislocation.  Read as negative.   Labs (all labs ordered are listed, but only abnormal results are displayed) Labs interpreted as -    Labs Reviewed - No data to display  Given Tylenol  for pain control.  Discussed RICE therapy.  Given information to follow-up as an outpatient with primary care provider and orthopedics.  Given a knee immobilizer and offered crutches but states that she did not want any.  Discussed weight-bear as tolerated.  Offered a tetanus shot but patient declined and states she wants to follow-up as an outpatient.  Discussed return precautions for any worsening symptoms.  No questions at time of discharge.     PROCEDURES:  Critical Care performed: No  Procedures  Patient's presentation is most consistent with acute complicated illness / injury requiring diagnostic workup.   MEDICATIONS ORDERED IN ED: Medications  acetaminophen  (TYLENOL ) tablet 1,000 mg (has no administration in time range)    FINAL CLINICAL IMPRESSION(S) / ED DIAGNOSES   Final diagnoses:  Sprain of right knee, unspecified ligament, initial encounter     Rx / DC Orders   ED Discharge Orders     None        Note:  This document was prepared using Dragon voice recognition software and may include unintentional dictation errors.   Suzanne Kirsch, MD 09/27/23 1050

## 2023-09-27 NOTE — ED Triage Notes (Signed)
 Pt comes with c/o knee pain. Pt states right knee pain. Pt states this started on Saturday. Pt states she had on heels and slipped and her knee hit the floor. Pt states her leg went the opposite direction. Pt states pain and swelling.

## 2023-09-27 NOTE — Discharge Instructions (Addendum)
 You were seen in the emergency department for right knee pain after a fall.  You had an x-ray done that did not show any broken bones.  You are given a knee immobilizer.  You can bear weight as tolerated.  You are offered crutches but you do not feel like you needed him.  Ice, rest and elevate your knee to help with the swelling.  Alternate Motrin  and Tylenol  to help for pain control and anti-inflammatory.  You are given information to follow-up with orthopedics if you have ongoing knee pain and swelling to further evaluate for ligamentous or meniscus tear.  Return for any worsening symptoms.  Pain control:  Ibuprofen  (motrin /aleve/advil ) - You can take 3 tablets (600 mg) every 6 hours as needed for pain/fever.  Acetaminophen  (tylenol ) - You can take 2 extra strength tablets (1000 mg) every 6 hours as needed for pain/fever.  You can alternate these medications or take them together.  Make sure you eat food/drink water when taking these medications.

## 2023-10-12 ENCOUNTER — Encounter: Payer: Self-pay | Admitting: Orthopaedic Surgery

## 2023-10-12 ENCOUNTER — Ambulatory Visit (INDEPENDENT_AMBULATORY_CARE_PROVIDER_SITE_OTHER): Admitting: Orthopaedic Surgery

## 2023-10-12 DIAGNOSIS — M25561 Pain in right knee: Secondary | ICD-10-CM

## 2023-10-12 DIAGNOSIS — G8929 Other chronic pain: Secondary | ICD-10-CM

## 2023-10-12 NOTE — Progress Notes (Signed)
 Office Visit Note   Patient: Robin Roman           Date of Birth: August 13, 1995           MRN: [id number] Visit Date: 10/12/2023              Requested by: No referring provider defined for this encounter. PCP: Pcp, No   Assessment & Plan: Visit Diagnoses:  1. Chronic pain of right knee     Plan: History of Present Illness Robin Roman is a 28 year old female who presents with right knee pain following an injury.  On August 8th, she twisted her right knee while wearing heels, experiencing a popping sensation followed by a fall. X-rays were performed at [facility]. She continues to experience pain with pressure on the knee and difficulty with full flexion. Pain occurs when kneeling or applying pressure to the anterior knee. The knee feels tight when bent, with discomfort during certain movements. There is no swelling, redness, or warmth.  Physical Exam MUSCULOSKELETAL: Right knee ACL laxity compared to the left. Right knee tightness on flexion due to presence of effusion.  Collaterals are stable.  No joint line tenderness.  Dial test is normal.  Results RADIOLOGY Right knee X-ray: No soft tissue abnormalities detected (09/24/2023)  Assessment and Plan Right knee pain and instability, possible anterior cruciate ligament (ACL) tear Suspected ACL tear due to knee instability and popping sensation. X-rays negative for soft tissue injury. - Order MRI of the right knee to assess for ACL tear. - Refer to one of our sports surgeons if MRI confirms ACL tear.  Follow-Up Instructions: No follow-ups on file.   Orders:  Orders Placed This Encounter  Procedures   MR Knee Right w/o contrast   No orders of the defined types were placed in this encounter.     Procedures: No procedures performed   Clinical Data: No additional findings.   Subjective: Chief Complaint  Patient presents with   Right Knee - Pain    DOI 09/24/23    HPI  Review of Systems  Constitutional:  Negative.   HENT: Negative.    Eyes: Negative.   Respiratory: Negative.    Cardiovascular: Negative.   Endocrine: Negative.   Musculoskeletal: Negative.   Neurological: Negative.   Hematological: Negative.   Psychiatric/Behavioral: Negative.    All other systems reviewed and are negative.    Objective: Vital Signs: LMP 08/27/2023   Physical Exam Vitals and nursing note reviewed.  Constitutional:      Appearance: She is well-developed.  HENT:     Head: Atraumatic.     Nose: Nose normal.  Eyes:     Extraocular Movements: Extraocular movements intact.  Cardiovascular:     Pulses: Normal pulses.  Pulmonary:     Effort: Pulmonary effort is normal.  Abdominal:     Palpations: Abdomen is soft.  Musculoskeletal:     Cervical back: Neck supple.  Skin:    General: Skin is warm.     Capillary Refill: Capillary refill takes less than 2 seconds.  Neurological:     Mental Status: She is alert. Mental status is at baseline.  Psychiatric:        Behavior: Behavior normal.        Thought Content: Thought content normal.        Judgment: Judgment normal.     Ortho Exam  Specialty Comments:  No specialty comments available.  Imaging: No results found.   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 28 y.o. G2P0101 at [redacted]w[redacted]d Patient's last menstrual period was 01/20/2019. consistent with ultrasound @ [redacted]w[redacted]d .Estimated Date of Delivery: 10/27/19 Sex of  baby and name:  Baby boy     FOB:      Factors complicating this pregnancy  1. History of preterm delivery at [redacted]w[redacted]d  spontaneous preterm labor  Makena injections weekly, counseling &   Uterine size date discrepancy pregnancy 09/11/2019    Family History  Problem Relation Age of Onset   Cancer Paternal Grandfather    Healthy Paternal Grandmother    Breast cancer Maternal Grandmother    Cancer Maternal Grandfather    Healthy Father    Migraines Mother    Healthy Half-Brother    Healthy Half-Sister    Healthy Half-Sister     Past Surgical History:  Procedure Laterality Date   NO PAST SURGERIES     Wisdom tooth removal  2022   1 tooth   Social History   Occupational History   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

## 2023-10-13 ENCOUNTER — Encounter: Payer: Self-pay | Admitting: Orthopaedic Surgery

## 2023-10-16 ENCOUNTER — Ambulatory Visit
Admission: RE | Admit: 2023-10-16 | Discharge: 2023-10-16 | Disposition: A | Discharge status: Home / Self Care | Source: Ambulatory Visit | Attending: Orthopaedic Surgery | Admitting: Orthopaedic Surgery

## 2023-10-16 DIAGNOSIS — G8929 Other chronic pain: Secondary | ICD-10-CM

## 2023-10-19 ENCOUNTER — Telehealth: Payer: Self-pay | Admitting: Orthopedic Surgery

## 2023-10-19 ENCOUNTER — Ambulatory Visit: Payer: Self-pay | Admitting: Orthopaedic Surgery

## 2023-10-19 NOTE — Telephone Encounter (Signed)
 Called pt and left vm per Addie pt need to be seen either Wednesday. Thursday afternoon or Friday morning for ACL Tear with Addie or Herlene

## 2023-10-19 NOTE — Telephone Encounter (Signed)
-----   Message from KANDICE Glendia Hutchinson sent at 10/19/2023 11:26 AM EDT ----- Thanks Ozell.  Chandria Rookstool can you get her set up to see me sometime this week.  Thanks ----- Message ----- From: Jerri Kay HERO, MD Sent: 10/19/2023  11:24 AM EDT To: Cordella Glendia Hutchinson, MD; Tinnie Lyme, RT  Please refer to Dr. Hutchinson for ACL tear. ----- Message ----- From: Interface, Rad Results In Sent: 10/16/2023  10:09 AM EDT To: Kay HERO Jerri, MD

## 2023-10-19 NOTE — Progress Notes (Signed)
 Please refer to Dr. Addie for ACL tear.

## 2023-10-19 NOTE — Telephone Encounter (Signed)
 Can you please help me with this? Just let her know she will have to wait.

## 2023-10-19 NOTE — Progress Notes (Signed)
 Thanks Ozell.  Lauren can you get her set up to see me sometime this week.  Thanks

## 2023-10-21 ENCOUNTER — Ambulatory Visit: Admitting: Orthopedic Surgery

## 2023-10-21 ENCOUNTER — Telehealth: Payer: Self-pay | Admitting: Orthopedic Surgery

## 2023-10-21 NOTE — Telephone Encounter (Signed)
 Pt calme in for 1:15 pm appt but did not have co pay of $4.00 and was told need to reschedule. Can we please reschedule her. Please call pt about this matter at [phone].

## 2023-11-01 NOTE — Telephone Encounter (Signed)
Ok for 9 AM.

## 2023-11-01 NOTE — Telephone Encounter (Signed)
 Is this ok with you? Your schedule is full for this week

## 2023-11-05 ENCOUNTER — Ambulatory Visit: Admitting: Surgical

## 2023-11-05 DIAGNOSIS — S83511A Sprain of anterior cruciate ligament of right knee, initial encounter: Secondary | ICD-10-CM

## 2023-11-07 ENCOUNTER — Encounter: Payer: Self-pay | Admitting: Surgical

## 2023-11-07 NOTE — Progress Notes (Signed)
 Office Visit Note   Patient: Robin Roman           Date of Birth: Sep 27, 1995           MRN: [id number] Visit Date: 11/05/2023 Requested by: No referring provider defined for this encounter. PCP: Pcp, No  Subjective: Chief Complaint  Patient presents with   Right Knee - Pain    HPI: Robin Roman is a 28 y.o. female who presents to the office reporting right knee pain and instability.  Patient sustained right knee ACL tear as diagnosed on MRI scan.  Has been referred by Dr. Jerri to discuss surgery.  She states she has merrily medial sided knee pain as well as pain that radiates into the shin.  She describes instability if she tries to pivot solely on her right knee.  Pain was getting better but then she felt a recent pop about 6 days ago and now it is more painful than it was at the time that she saw Dr. Jerri.  Taking Tylenol  with relief.  Also using ice and heat.  She is a mother to a 38-year-old and 51-year-old.  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  Denies any personal or family history of DVT/PE.  Denies any significant medical history..                ROS: All systems reviewed are negative as they relate to the chief complaint within the history of present illness.  Patient denies fevers or chills.  Assessment & Plan: Visit Diagnoses:  1. Complete tear of anterior cruciate ligament of right knee, initial encounter     Plan: Impression is 28 year old female sustained right knee ACL tear a little over a month ago.  She has MRI demonstrating full-thickness tear of the ACL as well as sprain of the FCL and medial meniscal tear.  She has a little bit of laxity to varus stressing but no posterolateral rotational instability on exam today.  We discussed options available patient.  Best option for her to avoid long-term arthritis in her knee would be to have ACL reconstruction and medial meniscal tear repair  versus debridement.  May need posterolateral corner reconstruction at the time based on examination under anesthesia.  Had lengthy discussion about graft options with patient today.  Ultimately, she does not live a very high demand lifestyle in terms of athletic endeavors.  She would like to optimize how quick she can recover before returning to work and taking care of her children and would like to have bone patellar tendon bone allograft as her ACL graft option.  Plan to place her for surgery to include ACL reconstruction with bone patellar tendon bone allograft with augmentation from bone marrow aspirate concentrate harvested from iliac crest, medial meniscal tear repair versus debridement, possible posterolateral corner reconstruction.  Follow-Up Instructions: No follow-ups on file.   Orders:  No orders of the defined types were placed in this encounter.  No orders of the defined types were placed in this encounter.     Procedures: No procedures performed   Clinical Data: No additional findings.  Objective: Vital Signs: There were no vitals taken for this visit.  Physical Exam:  Constitutional: Patient appears well-developed HEENT:  Head: Normocephalic Eyes:EOM are normal Neck: Normal range of motion Cardiovascular: Normal rate Pulmonary/chest: Effort normal Neurologic: Patient is alert Skin: Skin is warm Psychiatric: Patient has normal mood and affect  Ortho Exam: Ortho exam demonstrates right knee with positive effusion.  No cellulitis or skin changes noted over the right knee region.  She has range of motion from -3 degrees extension to about 110 degrees of knee flexion.  Good quad strength rated 5/5.  Hamstring strength intact rated 5/5.  Intact ankle dorsiflexion and plantarflexion.  Palpable DP pulse.  She has tenderness over the medial joint line and no tenderness over the lateral joint line.  She has mildly increased laxity to anterior drawer but more laxity to testing in  extension with positive Lachman exam.  No significant endpoint is present.  She is stable to varus and valgus stress with good endpoint at 0 and 30 degrees but she does have some slight laxity of varus stressing compared with the contralateral side.  No posterolateral rotational instability.  Negative posterior drawer sign.  Specialty Comments:  No specialty comments available.  Imaging: No results found.   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 28 y.o. G2P0101 at [redacted]w[redacted]d Patient's last menstrual period was 01/20/2019. consistent with ultrasound @ [redacted]w[redacted]d .Estimated Date of Delivery: 10/27/19 Sex of baby and name:  Baby boy     FOB:      Factors complicating this pregnancy  1. History of preterm delivery at [redacted]w[redacted]d  spontaneous preterm labor  Makena injections weekly, counseling &   Uterine size date discrepancy pregnancy 09/11/2019    Family History  Problem Relation Age of Onset   Cancer Paternal Grandfather    Healthy Paternal Grandmother    Breast cancer Maternal Grandmother    Cancer Maternal Grandfather    Healthy Father    Migraines Mother    Healthy Half-Brother    Healthy Half-Sister    Healthy Half-Sister     Past Surgical History:  Procedure Laterality Date   NO PAST SURGERIES     Wisdom tooth removal  2022   1 tooth   Social History   Occupational History   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

## 2023-11-19 ENCOUNTER — Ambulatory Visit: Admitting: Orthopedic Surgery

## 2023-12-05 ENCOUNTER — Emergency Department
Admission: EM | Admit: 2023-12-05 | Discharge: 2023-12-05 | Disposition: A | Discharge status: Home / Self Care | Attending: Emergency Medicine | Admitting: Emergency Medicine

## 2023-12-05 ENCOUNTER — Other Ambulatory Visit: Payer: Self-pay

## 2023-12-05 DIAGNOSIS — K047 Periapical abscess without sinus: Secondary | ICD-10-CM | POA: Insufficient documentation

## 2023-12-05 DIAGNOSIS — K0889 Other specified disorders of teeth and supporting structures: Secondary | ICD-10-CM

## 2023-12-05 MED ORDER — CLINDAMYCIN HCL 150 MG PO CAPS
300.0000 mg | ORAL_CAPSULE | Freq: Once | ORAL | Status: AC
  Administered 2023-12-05: 300 mg via ORAL
  Filled 2023-12-05: qty 2

## 2023-12-05 MED ORDER — CLINDAMYCIN HCL 300 MG PO CAPS: 300.0000 mg | ORAL_CAPSULE | Freq: Three times a day (TID) | ORAL | 0 refills | Status: AC

## 2023-12-05 NOTE — ED Provider Notes (Signed)
   [facility] Provider Note    Event Date/Time   First MD Initiated Contact with Patient 12/05/23 [phone]     (approximate)  History   Chief Complaint: Dental Pain  HPI  Robin Roman is a 28 y.o. female with no significant past medical history presents to the emergency department for right upper dental pain.  According to the patient for the past 2 days she has had worsening right upper dental pain.  Describes as a throbbing type pain.  Patient states she had a couple pills of amoxicillin  leftover from when she had a left-sided dental infection last year.  She took those but still having pain so she came to the emergency department.  No fever.  Physical Exam   Triage Vital Signs: ED Triage Vitals  Encounter Vitals Group     BP 12/05/23 0512 (!) 149/99     Girls Systolic BP Percentile --      Girls Diastolic BP Percentile --      Boys Systolic BP Percentile --      Boys Diastolic BP Percentile --      Pulse Rate 12/05/23 0512 61     Resp 12/05/23 0512 18     Temp 12/05/23 0512 98.5 F (36.9 C)     Temp Source 12/05/23 0512 Oral     SpO2 12/05/23 0512 100 %     Weight 12/05/23 0514 230 lb (104.3 kg)     Height 12/05/23 0514 5' 7 (1.702 m)     Head Circumference --      Peak Flow --      Pain Score 12/05/23 0513 7     Pain Loc --      Pain Education --      Exclude from Growth Chart --     Most recent vital signs: Vitals:   12/05/23 0512  BP: (!) 149/99  Pulse: 61  Resp: 18  Temp: 98.5 F (36.9 C)  SpO2: 100%    General: Awake, no distress.  Other:  Patient has a fractured tooth below the gumline in the right upper molar.  No abscess.   ED Results / Procedures / Treatments   MEDICATIONS ORDERED IN ED: Medications  clindamycin  (CLEOCIN ) capsule 300 mg (has no administration in time range)     IMPRESSION / MDM / ASSESSMENT AND PLAN / ED COURSE  I reviewed the triage vital signs and the nursing notes.  Patient's presentation is  most consistent with acute illness / injury with system symptoms.  Patient presents emergency department for 2 days of worsening right upper dental pain.  Patient has a fractured tooth below the gumline in this area highly suspect dental infection.  No sign of abscess on my examination.  Will provide the patient a list of dental clinics.  She states she will call on Monday.  We will place the patient on clindamycin  3 times daily for 10 days.  Patient agreeable to plan of care.  FINAL CLINICAL IMPRESSION(S) / ED DIAGNOSES   Dental pain Dental infection   Note:  This document was prepared using Dragon voice recognition software and may include unintentional dictation errors.   Dorothyann Drivers, MD 12/05/23 [phone]

## 2023-12-05 NOTE — Discharge Instructions (Addendum)

## 2023-12-05 NOTE — ED Triage Notes (Signed)
 Pt presented to ED with c/o right sided dental pain x 2-3 days. States took tylenol  without relief of symptoms. Had left sided dental pain 1 month ago and was prescribed abx. States took 3 doses of amoxicillin  from previous dental pain without relief of symptoms. Unable to get into dentist. Denies fever.

## 2023-12-20 ENCOUNTER — Encounter: Payer: Self-pay | Admitting: Radiology

## 2023-12-20 ENCOUNTER — Encounter: Payer: Self-pay | Admitting: Orthopaedic Surgery

## 2023-12-21 NOTE — Telephone Encounter (Signed)
 I sent her to [facility] for surgery so not sure the status.

## 2023-12-24 ENCOUNTER — Telehealth: Payer: Self-pay | Admitting: Orthopedic Surgery

## 2023-12-24 NOTE — Telephone Encounter (Signed)
 Called patient to offer surgery date for right ACL reconstruction with Dr. Addie.  Patient states she is not able to schedule surgery at this time because of her job not offering any paid leave.   Patient was advised to refrain from activities that could potentially cause more damage to her right knee, such as running, jumping, pivoting, cutting, and dancing.  Patient has name and direct number for scheduling.  She will consider surgery in the spring.

## 2024-02-04 ENCOUNTER — Emergency Department
Admission: EM | Admit: 2024-02-04 | Discharge: 2024-02-04 | Disposition: A | Discharge status: Home / Self Care | Attending: Emergency Medicine | Admitting: Emergency Medicine

## 2024-02-04 ENCOUNTER — Other Ambulatory Visit: Payer: Self-pay

## 2024-02-04 DIAGNOSIS — J029 Acute pharyngitis, unspecified: Secondary | ICD-10-CM | POA: Diagnosis present

## 2024-02-04 DIAGNOSIS — J028 Acute pharyngitis due to other specified organisms: Secondary | ICD-10-CM | POA: Insufficient documentation

## 2024-02-04 DIAGNOSIS — H6123 Impacted cerumen, bilateral: Secondary | ICD-10-CM | POA: Insufficient documentation

## 2024-02-04 DIAGNOSIS — B9789 Other viral agents as the cause of diseases classified elsewhere: Secondary | ICD-10-CM | POA: Insufficient documentation

## 2024-02-04 LAB — RESP PANEL BY RT-PCR (RSV, FLU A&B, COVID)  RVPGX2
Influenza A by PCR: NEGATIVE
Influenza B by PCR: NEGATIVE
Resp Syncytial Virus by PCR: NEGATIVE
SARS Coronavirus 2 by RT PCR: NEGATIVE

## 2024-02-04 LAB — GROUP A STREP BY PCR: Group A Strep by PCR: NOT DETECTED

## 2024-02-04 MED ORDER — LIDOCAINE VISCOUS HCL 2 % MT SOLN: 15.0000 mL | OROMUCOSAL | 0 refills | Status: AC | PRN

## 2024-02-04 MED ORDER — CARBAMIDE PEROXIDE 6.5 % OT SOLN: 5.0000 [drp] | Freq: Two times a day (BID) | OTIC | 0 refills | Status: AC

## 2024-02-04 NOTE — Discharge Instructions (Addendum)
 You are seen in the emergency department for a viral sore throat and bilateral earwax.  Please pick up and take the medication as prescribed for your ears.  Follow this with mixing hydrogen peroxide and warm water and putting this into your earlobes, letting it sit for 3 to 5 minutes and then turning your ear over to let the wax come out.  Follow-up with the ENT doctor listed.  I have also sent in a referral to primary care.  Please of the list of resources and given their offices call to establish care.  Drink cool liquids and soft foods for your sore throat.  I have sent in a prescription to help with pain.  Return the emergency department with any new or worsening symptoms.

## 2024-02-04 NOTE — ED Triage Notes (Signed)
 Pt reports pain/pressure in her head and ears. Pt endorses sore throat. Pt reports sx started yesterday.

## 2024-02-04 NOTE — ED Provider Notes (Signed)
 "  [facility] Provider Note    Event Date/Time   First MD Initiated Contact with Patient 02/04/24 1236     (approximate)   History   Sore Throat   HPI  Robin Roman is a 28 y.o. female  with a past medical history of migraines presents to the emergency department with sore throat, chills, myalgias and b/l ear fullness that started yesterday.  She denies headaches, neck pain, shortness of breath, chest pain, urinary symptoms, abdominal pain, vomiting or diarrhea.  She is unsure if she has been around anyone sick at home.  Physical Exam   Triage Vital Signs: ED Triage Vitals [02/04/24 1151]  Encounter Vitals Group     BP (!) 148/73     Girls Systolic BP Percentile      Girls Diastolic BP Percentile      Boys Systolic BP Percentile      Boys Diastolic BP Percentile      Pulse Rate 81     Resp 17     Temp 98.2 F (36.8 C)     Temp Source Oral     SpO2 99 %     Weight 220 lb (99.8 kg)     Height 5' 7 (1.702 m)     Head Circumference      Peak Flow      Pain Score 7     Pain Loc      Pain Education      Exclude from Growth Chart     Most recent vital signs: Vitals:   02/04/24 1251 02/04/24 1424  BP: 133/80 133/82  Pulse: 68 68  Resp: 14 17  Temp:  98.2 F (36.8 C)  SpO2: 97% 99%    General: Awake, in no acute distress. Appears stated age. Head: Normocephalic, atraumatic. Eyes: No scleral icterus or conjunctival injection. Ears/Nose/Throat: TMs intact b/l with cerumen present. Nares patent, no nasal discharge. Oropharynx moist, no erythema or exudate. Dentition intact. No drooling, no trismus. Neck: Supple, no lymphadenopathy, no nuchal rigidity. CV: Good peripheral perfusion. RRR, 68 bpm. Respiratory:Normal respiratory effort.  No respiratory distress. CTAB. GI: Soft, non-distended, non-tender.  Skin:Warm, dry, intact. No rashes, lesions, or ecchymosis. No cyanosis or pallor. Neurological: A&Ox4 to person, place, time, and  situation.   ED Results / Procedures / Treatments   Labs (all labs ordered are listed, but only abnormal results are displayed) Labs Reviewed  GROUP A STREP BY PCR  RESP PANEL BY RT-PCR (RSV, FLU A&B, COVID)  [id number]     EKG     RADIOLOGY    PROCEDURES:  Critical Care performed: No   Procedures   MEDICATIONS ORDERED IN ED: Medications - No data to display   IMPRESSION / MDM / ASSESSMENT AND PLAN / ED COURSE  I reviewed the triage vital signs and the nursing notes.                              Differential diagnosis includes, but is not limited to, cerumen impaction, otitis media, strep pharyngitis, viral pharyngitis, COVID, flu, RSV  Patient's presentation is most consistent with acute complicated illness / injury requiring diagnostic workup.  Patient is a 28 year old female presenting with signs and symptoms as described above.  She is well-appearing with normal vital signs.  She has symptoms of a viral URI, but her respiratory panel is negative.  The strep PCR is negative.  Believe this may be  consistent with a viral pharyngitis.  Her ears are filled with cerumen bilaterally and no signs of infection.  I did send a prescription for Debrox, discussed combo of hydrogen peroxide and warm water for her ears for her as well as lidocaine  solution to help with her throat pain.  She does not have a PCP, so I did give her an ambulatory referral to primary care.  Also gave her a outpatient follow-up with ENT to clean her ears out if needed.  The patient may return to the emergency department for any new, worsening, or concerning symptoms. Patient was given the opportunity to ask questions; all questions were answered. Emergency department return precautions were discussed with the patient.  Patient is in agreement to the treatment plan.  Patient is stable for discharge.    FINAL CLINICAL IMPRESSION(S) / ED DIAGNOSES   Final diagnoses:  Bilateral impacted cerumen  Viral  pharyngitis     Rx / DC Orders   ED Discharge Orders          Ordered    Ambulatory Referral to Primary Care (Establish Care)        02/04/24 1412    carbamide peroxide (DEBROX) 6.5 % OTIC solution  2 times daily        02/04/24 1413    lidocaine  (XYLOCAINE ) 2 % solution  As needed        02/04/24 1413             Note:  This document was prepared using Dragon voice recognition software and may include unintentional dictation errors.     Sheron Salm, PA-C 02/04/24 1752    Dicky Anes, MD 02/04/24 2117  "

## 2024-02-04 NOTE — ED Notes (Signed)
 Vital signs stable.

## 2024-03-12 ENCOUNTER — Other Ambulatory Visit: Payer: Self-pay

## 2024-03-12 ENCOUNTER — Emergency Department (HOSPITAL_COMMUNITY)
Admission: EM | Admit: 2024-03-12 | Discharge: 2024-03-12 | Disposition: A | Discharge status: Home / Self Care | Attending: Emergency Medicine | Admitting: Emergency Medicine

## 2024-03-12 ENCOUNTER — Encounter (HOSPITAL_COMMUNITY): Payer: Self-pay

## 2024-03-12 DIAGNOSIS — S50811A Abrasion of right forearm, initial encounter: Secondary | ICD-10-CM | POA: Insufficient documentation

## 2024-03-12 DIAGNOSIS — Z7289 Other problems related to lifestyle: Secondary | ICD-10-CM

## 2024-03-12 DIAGNOSIS — F430 Acute stress reaction: Secondary | ICD-10-CM

## 2024-03-12 DIAGNOSIS — F418 Other specified anxiety disorders: Secondary | ICD-10-CM | POA: Insufficient documentation

## 2024-03-12 DIAGNOSIS — X58XXXA Exposure to other specified factors, initial encounter: Secondary | ICD-10-CM | POA: Insufficient documentation

## 2024-03-12 MED ORDER — BACITRACIN ZINC 500 UNIT/GM EX OINT
TOPICAL_OINTMENT | Freq: Once | CUTANEOUS | Status: AC
  Administered 2024-03-12: 1 via TOPICAL
  Filled 2024-03-12: qty 0.9
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
## 2024-03-12 NOTE — ED Notes (Signed)
 Urine sample has been sent to the lab incase it is needed

## 2024-03-12 NOTE — ED Notes (Signed)
Dr. Steinl at bedside 

## 2024-03-12 NOTE — Discharge Instructions (Signed)
 It was our pleasure to provide your ER care today - we hope that you feel better.  Follow up closely with primary care doctor and behavioral health provider in the coming week. See resource guide provided for outpatient counseling and therapy resources.   For mental health issues and/or crisis, you may also go directly to the [facility] - they are open 24/7 and walk-ins are welcome.    Return to ER if worse, new symptoms, fevers, chest pain, trouble breathing, any thoughts of harm to self or others, severe depression,  or other emergency concern.
